# Patient Record
Sex: Male | Born: 1990 | Race: White | Hispanic: No | Marital: Single | Smoking: Current every day smoker
Health system: Southern US, Community
[De-identification: ages and names within clinical notes are randomized; demographics above are authoritative.]

## PROBLEM LIST (undated history)

## (undated) DIAGNOSIS — Z59819 Housing instability, housed unspecified: Secondary | ICD-10-CM

## (undated) DIAGNOSIS — R4689 Other symptoms and signs involving appearance and behavior: Secondary | ICD-10-CM

## (undated) DIAGNOSIS — Z560 Unemployment, unspecified: Secondary | ICD-10-CM

## (undated) DIAGNOSIS — F6381 Intermittent explosive disorder: Secondary | ICD-10-CM

## (undated) DIAGNOSIS — S069XAA Unspecified intracranial injury with loss of consciousness status unknown, initial encounter: Secondary | ICD-10-CM

## (undated) DIAGNOSIS — F99 Mental disorder, not otherwise specified: Secondary | ICD-10-CM

## (undated) DIAGNOSIS — F1010 Alcohol abuse, uncomplicated: Secondary | ICD-10-CM

## (undated) DIAGNOSIS — B159 Hepatitis A without hepatic coma: Secondary | ICD-10-CM

## (undated) DIAGNOSIS — F23 Brief psychotic disorder: Secondary | ICD-10-CM

## (undated) DIAGNOSIS — F329 Major depressive disorder, single episode, unspecified: Secondary | ICD-10-CM

## (undated) DIAGNOSIS — Z5900 Homelessness unspecified: Secondary | ICD-10-CM

## (undated) DIAGNOSIS — Z650 Conviction in civil and criminal proceedings without imprisonment: Secondary | ICD-10-CM

## (undated) DIAGNOSIS — F17200 Nicotine dependence, unspecified, uncomplicated: Secondary | ICD-10-CM

## (undated) DIAGNOSIS — R45851 Suicidal ideations: Secondary | ICD-10-CM

## (undated) DIAGNOSIS — F1910 Other psychoactive substance abuse, uncomplicated: Secondary | ICD-10-CM

## (undated) DIAGNOSIS — Z720 Tobacco use: Secondary | ICD-10-CM

## (undated) DIAGNOSIS — F1210 Cannabis abuse, uncomplicated: Secondary | ICD-10-CM

## (undated) DIAGNOSIS — F319 Bipolar disorder, unspecified: Secondary | ICD-10-CM

## (undated) DIAGNOSIS — F603 Borderline personality disorder: Secondary | ICD-10-CM

## (undated) DIAGNOSIS — R7689 Other specified abnormal immunological findings in serum: Secondary | ICD-10-CM

## (undated) DIAGNOSIS — F29 Unspecified psychosis not due to a substance or known physiological condition: Secondary | ICD-10-CM

## (undated) DIAGNOSIS — R768 Other specified abnormal immunological findings in serum: Secondary | ICD-10-CM

## (undated) HISTORY — PX: TONSILLECTOMY: SUR1361

---
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
## 2010-05-18 LAB — COMPREHENSIVE METABOLIC PANEL
ALT: 41 U/L (ref 0–53)
Albumin: 4 g/dL (ref 3.5–5.2)
Alkaline Phosphatase: 42 U/L (ref 39–117)
CO2: 27 mEq/L (ref 19–32)
Creatinine, Ser: 0.86 mg/dL (ref 0.4–1.5)
GFR calc Af Amer: >60 mL/min (ref >60)
GFR calc non Af Amer: >60 mL/min (ref >60)
Glucose, Bld: 101 mg/dL — ABNORMAL HIGH (ref 70–99)
Potassium: 3.6 mEq/L (ref 3.5–5.1)
Total Protein: 6.5 g/dL (ref 6.0–8.3)

## 2010-05-18 LAB — BASIC METABOLIC PANEL
Chloride: 101 mEq/L (ref 96–112)
Sodium: 138 mEq/L (ref 135–145)

## 2010-05-18 LAB — SURGICAL PCR SCREEN: Staphylococcus aureus: POSITIVE — AB

## 2010-05-18 LAB — LACTIC ACID, PLASMA: Lactic Acid, Venous: 2.6 mmol/L — ABNORMAL HIGH (ref 0.5–2.2)

## 2010-05-18 LAB — CBC
HCT: 38.9 % — ABNORMAL LOW (ref 39.0–52.0)
HCT: 45.7 % (ref 39.0–52.0)
MCH: 29.6 pg (ref 26.0–34.0)
MCH: 30.2 pg (ref 26.0–34.0)
MCHC: 35 g/dL (ref 30.0–36.0)
MCHC: 35 g/dL (ref 30.0–36.0)
MCV: 84.7 fL (ref 78.0–100.0)
Platelets: 247 10*3/uL (ref 150–400)
RDW: 12.3 % (ref 11.5–15.5)
WBC: 11.8 10*3/uL — ABNORMAL HIGH (ref 4.0–10.5)
WBC: 14.7 10*3/uL — ABNORMAL HIGH (ref 4.0–10.5)

## 2010-05-18 LAB — POCT CARDIAC MARKERS
CKMB, poc: <1 ng/mL — ABNORMAL LOW (ref 1.0–8.0)
Myoglobin, poc: 218 ng/mL (ref 12–200)

## 2010-05-18 LAB — APTT: aPTT: 29 seconds (ref 24–37)

## 2010-05-18 LAB — SAMPLE TO BLOOD BANK

## 2010-05-18 LAB — POCT I-STAT, CHEM 8
BUN: 8 mg/dL (ref 6–23)
Calcium, Ion: 1.13 mmol/L (ref 1.12–1.32)
Chloride: 105 mEq/L (ref 96–112)
Creatinine, Ser: 0.9 mg/dL (ref 0.4–1.5)
Glucose, Bld: 101 mg/dL — ABNORMAL HIGH (ref 70–99)
Sodium: 141 mEq/L (ref 135–145)

## 2010-05-18 LAB — RAPID URINE DRUG SCREEN, HOSP PERFORMED
Barbiturates: NOT DETECTED
Benzodiazepines: POSITIVE — AB
Cocaine: NOT DETECTED
Opiates: POSITIVE — AB

## 2010-05-18 LAB — PROTIME-INR
INR: 1.03 (ref 0.00–1.49)
Prothrombin Time: 13.7 seconds (ref 11.6–15.2)

## 2010-05-18 LAB — URINALYSIS, ROUTINE W REFLEX MICROSCOPIC
Glucose, UA: NEGATIVE mg/dL
Nitrite: NEGATIVE
Protein, ur: NEGATIVE mg/dL
Specific Gravity, Urine: 1.022 (ref 1.005–1.030)
Urobilinogen, UA: 1 mg/dL (ref 0.0–1.0)

## 2010-06-11 LAB — CBC
HCT: 44.2 % (ref 36.0–49.0)
Hemoglobin: 15.7 g/dL (ref 12.0–16.0)
MCV: 85.6 fL (ref 78.0–98.0)
Platelets: 261 10*3/uL (ref 150–400)
RBC: 5.16 MIL/uL (ref 3.80–5.70)
RDW: 13.1 % (ref 11.4–15.5)

## 2010-06-11 LAB — BASIC METABOLIC PANEL
BUN: 9 mg/dL (ref 6–23)
Calcium: 9.7 mg/dL (ref 8.4–10.5)
Creatinine, Ser: 0.73 mg/dL (ref 0.4–1.5)
Glucose, Bld: 105 mg/dL — ABNORMAL HIGH (ref 70–99)
Sodium: 141 mEq/L (ref 135–145)

## 2010-06-11 LAB — DIFFERENTIAL: Lymphocytes Relative: 32 % (ref 24–48)

## 2010-06-11 LAB — RAPID URINE DRUG SCREEN, HOSP PERFORMED
Barbiturates: NOT DETECTED
Tetrahydrocannabinol: POSITIVE — AB

## 2011-06-11 IMAGING — RF DG HUMERUS 2V *L*
1 series · 2 of 2 positions shown · non-contrast
Comparison: Plain films 10/09/2009.

CLINICAL DATA: Fracture fixation.

LEFT HUMERUS - 2+ VIEW

[Series 1: run · 2 of 2 slices shown]
[im 1/2]
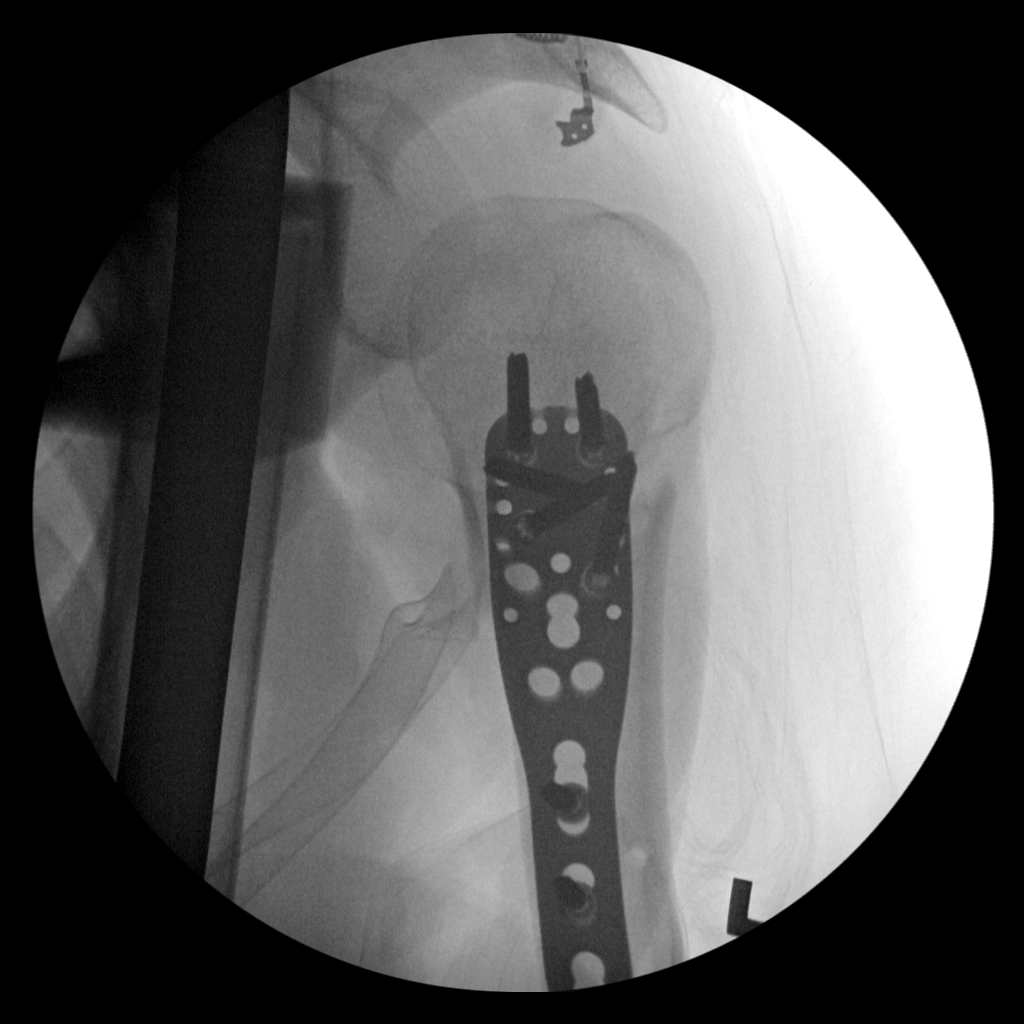
[im 2/2]
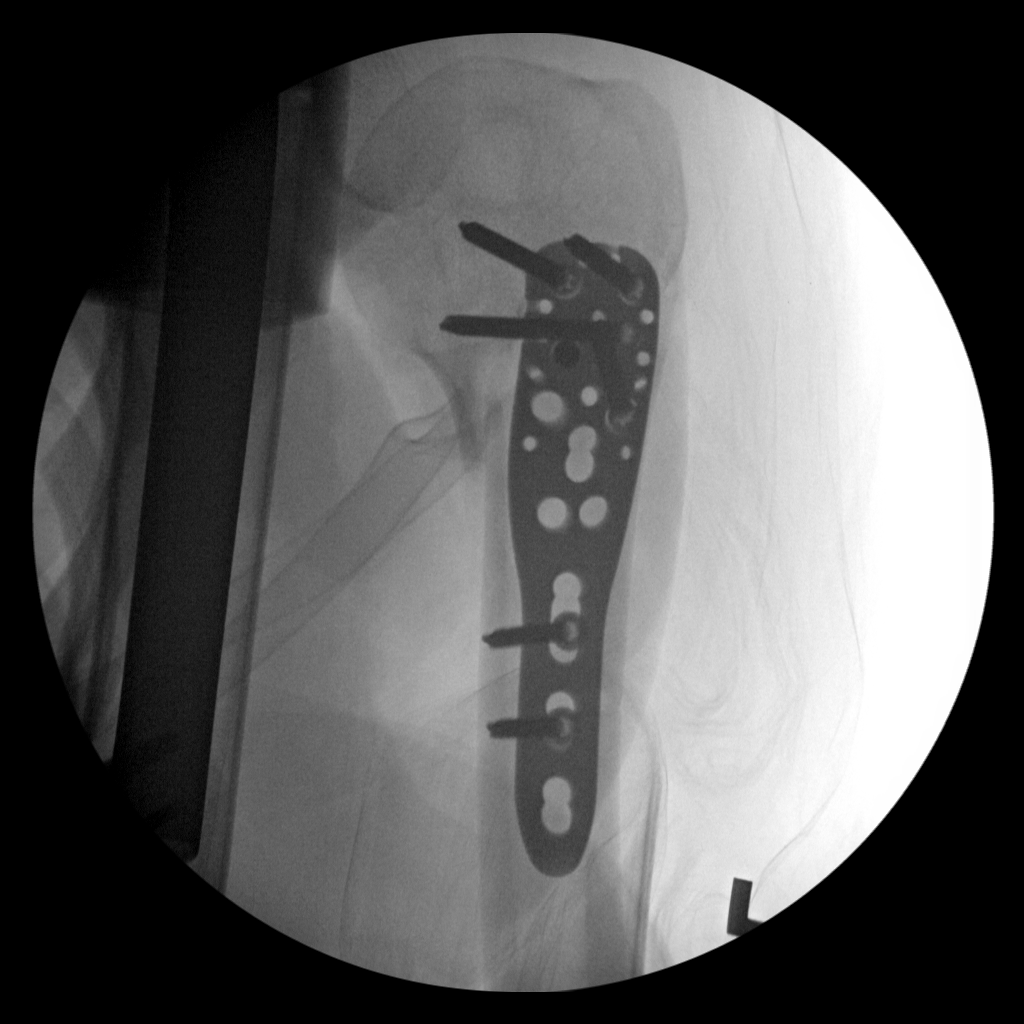

[2 of 2 positions shown; findings below may reference images not displayed]

FINDINGS: We are provided two fluoroscopic intraoperative spot
views of the left humerus.  Images demonstrate placement of a plate
and screws for fixation of a surgical neck fracture.  No evidence
of complication.
IMPRESSION: ORIF left hip fracture.

## 2012-05-13 ENCOUNTER — Emergency Department (HOSPITAL_COMMUNITY)
Admission: EM | Admit: 2012-05-13 | Discharge: 2012-05-13 | Disposition: A | Discharge status: Home / Self Care | Payer: Self-pay | Attending: Emergency Medicine | Admitting: Emergency Medicine

## 2012-05-13 ENCOUNTER — Encounter (HOSPITAL_COMMUNITY): Payer: Self-pay | Admitting: Emergency Medicine

## 2012-05-13 DIAGNOSIS — R109 Unspecified abdominal pain: Secondary | ICD-10-CM | POA: Insufficient documentation

## 2012-05-13 DIAGNOSIS — R197 Diarrhea, unspecified: Secondary | ICD-10-CM | POA: Insufficient documentation

## 2012-05-13 LAB — COMPREHENSIVE METABOLIC PANEL
Alkaline Phosphatase: 48 U/L (ref 39–117)
BUN: 12 mg/dL (ref 6–23)
Calcium: 9.5 mg/dL (ref 8.4–10.5)
GFR calc Af Amer: >90 mL/min (ref >90)
Glucose, Bld: 131 mg/dL — ABNORMAL HIGH (ref 70–99)
Potassium: 3.3 mEq/L — ABNORMAL LOW (ref 3.5–5.1)
Total Protein: 7.8 g/dL (ref 6.0–8.3)

## 2012-05-13 LAB — CBC
Hemoglobin: 16.3 g/dL (ref 13.0–17.0)
MCH: 30.2 pg (ref 26.0–34.0)
MCHC: 35.4 g/dL (ref 30.0–36.0)
Platelets: 226 10*3/uL (ref 150–400)
RBC: 5.39 MIL/uL (ref 4.22–5.81)
RDW: 12.3 % (ref 11.5–15.5)

## 2012-05-13 MED ORDER — POTASSIUM CHLORIDE CRYS ER 20 MEQ PO TBCR
40.0000 meq | EXTENDED_RELEASE_TABLET | Freq: Once | ORAL | Status: AC
  Administered 2012-05-13: 40 meq via ORAL
  Filled 2012-05-13: qty 2

## 2012-05-13 NOTE — ED Notes (Signed)
Pt c/o n/v/d starting this am around 10a.  Pt also c/o r lower back pain x1 day.

## 2012-05-13 NOTE — ED Provider Notes (Signed)
History     CSN: [id number]  Arrival date & time 05/13/12  1214   First MD Initiated Contact with Patient 05/13/12 1217      Chief Complaint  Patient presents with  . Nausea  . Emesis  . Diarrhea    (Consider location/radiation/quality/duration/timing/severity/associated sxs/prior treatment) HPI Comments: Patrick Washington is a 22 y.o. male with no significant past medical history presents to the emergency department with a chief complaint of abdominal pain.  Onset of symptoms began acutely this morning around 10 a.m. and were described as being in the right lower quadrant.  Pain was described as a "contraction" that was a 6/10 in severity.  At time of pain patient felt nauseous, but denies any emesis.  Symptoms were moderate but has since resolved completely. Pain did not radiate Abdominal pain lasted approximately 2 hours.  Patient's last normal bowel movement was yesterday.  Patient denies associated symptoms including flank pian, emesis, diarrhea, dysuria, hematuria, fever, night sweats, chills, anorexia, melena, hematochezia, hematemesis.  No other complaints at this time.  The history is provided by the patient.    History reviewed. No pertinent past medical history.  Past Surgical History  Procedure Laterality Date  . Tonsillectomy      No family history on file.  History  Substance Use Topics  . Smoking status: Not on file  . Smokeless tobacco: Not on file  . Alcohol Use: No      Review of Systems  Constitutional: Negative for fever, chills and appetite change.  HENT: Negative for congestion.   Eyes: Negative for visual disturbance.  Respiratory: Negative for shortness of breath.   Cardiovascular: Negative for chest pain and leg swelling.  Gastrointestinal: Positive for abdominal pain (since resolved ). Negative for vomiting, diarrhea, constipation, blood in stool, abdominal distention, anal bleeding and rectal pain.  Genitourinary: Negative for dysuria, urgency and  frequency.  Neurological: Negative for dizziness, syncope, weakness, light-headedness, numbness and headaches.  Psychiatric/Behavioral: Negative for confusion.  All other systems reviewed and are negative.    Allergies  Review of patient's allergies indicates no known allergies.  Home Medications  No current outpatient prescriptions on file.  BP 152/88  Pulse 75  Temp(Src) 97.4 F (36.3 C) (Oral)  Resp 16  SpO2 98%  Physical Exam  Constitutional: He is oriented to person, place, and time. He appears well-developed and well-nourished. No distress.  HENT:  Head: Normocephalic and atraumatic.  Mouth/Throat: Oropharynx is clear and moist. No oropharyngeal exudate.  Eyes: Conjunctivae and EOM are normal. Pupils are equal, round, and reactive to light. No scleral icterus.  Neck: Normal range of motion. Neck supple. No tracheal deviation present. No thyromegaly present.  Cardiovascular: Normal rate, regular rhythm, normal heart sounds and intact distal pulses.   Pulmonary/Chest: Effort normal and breath sounds normal. No stridor. No respiratory distress. He has no wheezes.  Abdominal: Soft. Bowel sounds are normal. He exhibits no distension and no mass. There is no tenderness. There is no rebound and no guarding.  Musculoskeletal: Normal range of motion. He exhibits no edema and no tenderness.  Neurological: He is alert and oriented to person, place, and time. Coordination normal.  Skin: Skin is warm and dry. No rash noted. He is not diaphoretic. No erythema. No pallor.  Psychiatric: He has a normal mood and affect. His behavior is normal.    ED Course  Procedures (including critical care time)  Labs Reviewed - No data to display No results found.   No diagnosis found.  BP 152/88  Pulse 75  Temp(Src) 97.4 F (36.3 C) (Oral)  Resp 16  SpO2 98%  MDM  Acute abdominal pain, resolved  Patient is a 22 year old male who presented to the emergency department complaining of  acute right lower quadrant abdominal pain lasting 2 hours.  There were no associated symptoms and pain has completely resolved.  Patient has been asymptomatic throughout his stay in the emergency department and has not required any medications.  Vital signs have been normal and labs reviewed without leukocytosis.  Mild hypokalemia seen which was replenished orally.  As patient is nontoxic and nonseptic appearing I do not feel further workup is indicated at this time.  Strict return precautions discussed if right lower quadrant abdominal pain presents again or for development of fever.  Patient is agreeable with plan and appears reliable for return.        Jaci Carrel, [place] 05/13/12 1449

## 2012-05-14 NOTE — ED Provider Notes (Signed)
Medical screening examination/treatment/procedure(s) were performed by non-physician practitioner and as supervising physician I was immediately available for consultation/collaboration.  Tobin Chad, MD 05/14/12 1006

## 2012-12-17 ENCOUNTER — Encounter (HOSPITAL_COMMUNITY): Payer: Self-pay | Admitting: Emergency Medicine

## 2012-12-17 ENCOUNTER — Emergency Department (HOSPITAL_COMMUNITY): Payer: No Typology Code available for payment source

## 2012-12-17 ENCOUNTER — Emergency Department (HOSPITAL_COMMUNITY)
Admission: EM | Admit: 2012-12-17 | Discharge: 2012-12-17 | Disposition: A | Discharge status: Home / Self Care | Payer: No Typology Code available for payment source | Attending: Emergency Medicine | Admitting: Emergency Medicine

## 2012-12-17 DIAGNOSIS — M545 Low back pain, unspecified: Secondary | ICD-10-CM | POA: Insufficient documentation

## 2012-12-17 DIAGNOSIS — Y9389 Activity, other specified: Secondary | ICD-10-CM | POA: Insufficient documentation

## 2012-12-17 DIAGNOSIS — Y92410 Unspecified street and highway as the place of occurrence of the external cause: Secondary | ICD-10-CM | POA: Insufficient documentation

## 2012-12-17 MED ORDER — METHOCARBAMOL 500 MG PO TABS: 500.0000 mg | ORAL_TABLET | Freq: Two times a day (BID) | ORAL | Status: DC

## 2012-12-17 MED ORDER — NAPROXEN 500 MG PO TABS: 500.0000 mg | ORAL_TABLET | Freq: Two times a day (BID) | ORAL | Status: DC

## 2012-12-17 MED ORDER — TRAMADOL HCL 50 MG PO TABS: 50.0000 mg | ORAL_TABLET | Freq: Four times a day (QID) | ORAL | Status: DC | PRN
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
## 2012-12-19 NOTE — ED Provider Notes (Signed)
Medical screening examination/treatment/procedure(s) were performed by non-physician practitioner and as supervising physician I was immediately available for consultation/collaboration.   Tajanay Hurley E Shenicka Sunderlin, MD 12/19/12 0812 
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
## 2019-03-05 DIAGNOSIS — K7682 Hepatic encephalopathy: Secondary | ICD-10-CM

## 2019-03-05 HISTORY — DX: Hepatic encephalopathy: K76.82

## 2020-07-13 ENCOUNTER — Emergency Department (HOSPITAL_COMMUNITY): Payer: Self-pay

## 2020-07-13 ENCOUNTER — Emergency Department (HOSPITAL_COMMUNITY)
Admission: EM | Admit: 2020-07-13 | Discharge: 2020-07-14 | Disposition: A | Discharge status: Home / Self Care | Payer: Self-pay | Attending: Emergency Medicine | Admitting: Emergency Medicine

## 2020-07-13 DIAGNOSIS — F1910 Other psychoactive substance abuse, uncomplicated: Secondary | ICD-10-CM

## 2020-07-13 DIAGNOSIS — S40212A Abrasion of left shoulder, initial encounter: Secondary | ICD-10-CM | POA: Insufficient documentation

## 2020-07-13 DIAGNOSIS — Y9302 Activity, running: Secondary | ICD-10-CM | POA: Insufficient documentation

## 2020-07-13 DIAGNOSIS — R4182 Altered mental status, unspecified: Secondary | ICD-10-CM | POA: Insufficient documentation

## 2020-07-13 DIAGNOSIS — W228XXA Striking against or struck by other objects, initial encounter: Secondary | ICD-10-CM | POA: Insufficient documentation

## 2020-07-13 DIAGNOSIS — Y92481 Parking lot as the place of occurrence of the external cause: Secondary | ICD-10-CM | POA: Insufficient documentation

## 2020-07-13 DIAGNOSIS — F29 Unspecified psychosis not due to a substance or known physiological condition: Secondary | ICD-10-CM | POA: Insufficient documentation

## 2020-07-13 DIAGNOSIS — R451 Restlessness and agitation: Secondary | ICD-10-CM | POA: Insufficient documentation

## 2020-07-13 MED ORDER — LORAZEPAM 2 MG/ML IJ SOLN: 2.0000 mg | Freq: Once | INTRAMUSCULAR | Status: DC

## 2020-07-13 MED ORDER — ZIPRASIDONE MESYLATE 20 MG IM SOLR: 20.0000 mg | Freq: Once | INTRAMUSCULAR | Status: DC
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
## 2020-07-13 NOTE — ED Notes (Signed)
X-ray at bedside
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
## 2020-07-14 ENCOUNTER — Emergency Department (HOSPITAL_COMMUNITY): Payer: Self-pay

## 2020-07-14 LAB — CBC WITH DIFFERENTIAL/PLATELET
Abs Immature Granulocytes: 0.03 10*3/uL (ref 0.00–0.07)
Basophils Absolute: 0 10*3/uL (ref 0.0–0.1)
Basophils Relative: 0 %
Eosinophils Absolute: 0 10*3/uL (ref 0.0–0.5)
Eosinophils Relative: 0 %
HCT: 48.8 % (ref 39.0–52.0)
Hemoglobin: 16.5 g/dL (ref 13.0–17.0)
Immature Granulocytes: 0 %
Lymphocytes Relative: 6 %
Lymphs Abs: 0.5 10*3/uL — ABNORMAL LOW (ref 0.7–4.0)
MCH: 30.3 pg (ref 26.0–34.0)
MCHC: 33.8 g/dL (ref 30.0–36.0)
MCV: 89.5 fL (ref 80.0–100.0)
Monocytes Absolute: 0.4 10*3/uL (ref 0.1–1.0)
Monocytes Relative: 5 %
Neutro Abs: 7.8 10*3/uL — ABNORMAL HIGH (ref 1.7–7.7)
Neutrophils Relative %: 89 %
Platelets: 164 10*3/uL (ref 150–400)
RBC: 5.45 MIL/uL (ref 4.22–5.81)
RDW: 12.6 % (ref 11.5–15.5)
WBC: 8.8 10*3/uL (ref 4.0–10.5)
nRBC: 0 % (ref 0.0–0.2)

## 2020-07-14 LAB — COMPREHENSIVE METABOLIC PANEL
ALT: 20 U/L (ref 0–44)
AST: 20 U/L (ref 15–41)
Albumin: 4.7 g/dL (ref 3.5–5.0)
Alkaline Phosphatase: 41 U/L (ref 38–126)
Anion gap: 7 (ref 5–15)
BUN: 12 mg/dL (ref 6–20)
CO2: 27 mmol/L (ref 22–32)
Calcium: 9.3 mg/dL (ref 8.9–10.3)
Chloride: 104 mmol/L (ref 98–111)
Creatinine, Ser: 0.86 mg/dL (ref 0.61–1.24)
GFR, Estimated: >60 mL/min (ref >60)
Glucose, Bld: 145 mg/dL — ABNORMAL HIGH (ref 70–99)
Potassium: 3.5 mmol/L (ref 3.5–5.1)
Sodium: 138 mmol/L (ref 135–145)
Total Bilirubin: 1.2 mg/dL (ref 0.3–1.2)
Total Protein: 7.3 g/dL (ref 6.5–8.1)

## 2020-07-14 LAB — SALICYLATE LEVEL: Salicylate Lvl: <7 mg/dL — ABNORMAL LOW (ref 7.0–30.0)

## 2020-07-14 LAB — RAPID URINE DRUG SCREEN, HOSP PERFORMED
Amphetamines: NOT DETECTED
Barbiturates: NOT DETECTED
Benzodiazepines: NOT DETECTED
Cocaine: NOT DETECTED
Opiates: NOT DETECTED
Tetrahydrocannabinol: POSITIVE — AB

## 2020-07-14 LAB — ACETAMINOPHEN LEVEL: Acetaminophen (Tylenol), Serum: <10 ug/mL — ABNORMAL LOW (ref 10–30)

## 2020-07-14 LAB — CK: Total CK: 69 U/L (ref 49–397)

## 2020-07-14 LAB — ETHANOL: Alcohol, Ethyl (B): <10 mg/dL (ref <10)

## 2020-07-14 NOTE — ED Notes (Signed)
Patient was incontinent of urine. Linens were changed. Patient was placed in a gown and socks. Patient is now sitting up eating, drinking and talking.
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
## 2020-07-14 NOTE — ED Notes (Signed)
Patient is refusing to go to CT. States he does not need it. I explained the reasoning for the CT.

## 2020-07-14 NOTE — ED Provider Notes (Signed)
Patient now awake and alert.  He reports he consumed large amount of mushrooms and that is the last thing he remembers.  He does not recall being agitated.  He is awake and alert and eating a sandwich.  We will cancel CT imaging.  He has no pain complaints.  He does have an abrasion to his left hand that is nontender. Will continue to monitor.     Zadie Rhine, MD 07/14/20 [phone]
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
## 2020-07-14 NOTE — Discharge Instructions (Addendum)
Substance Abuse Treatment Programs ° °Intensive Outpatient Programs °[facility]     °[address]      °[place], [place]                   °[phone]      ° °[facility] °[address] °[place], [place] °336-379-7146 ° °[facility]     °(Inpatient and outpatient)     °[address]           °[phone]   ° °[facility] °336-288-1484 (Suboxone and Methadone) ° °[address]      °[place], [place] [address]      °336-882-2125      ° °[address] °[place], [place] °852-3033 ° °[facility] (Outpatient/Inpatient, Chemical)    °(insurance only) [phone]      °       °[facility] (Groups & Residential) °[place], [place] °336-389-1413 ° °   °[facility]     °[address]     °[place], [place]      °[phone]      ° °[facility] (for caregivers and family) °[address] °[place], [place] °336-299-4655 ° ° ° ° ° °Residential Treatment Programs °[facility]      °[address], [place], [place] [address]  °([phone]      ° °[facility] °[address] [place], [place] [address] °[phone] ° °[facility]        °336-248-8914      ° °[facility] °1-800-659-3381 ° °[facility]             °[address]                                         °[place], [place]                                                °877-615-2722 or 336-784-9470                              ° °[facility] [place] °[address] °[place], [address] °1.877.941.8954 ° °[facility]    °[address]      °[place], [place]     °[phone]      ° °[facility] [place] [facility] °[address] °, [place] °336-285-9073 ° °[facility]   °[address]     °[place], [place] [address]     °336-899-1550      °Admissions: 8am-3pm M-F ° °[facility] °[address] °[place],  [place] °336-227-7417 ° °[facility]: Residential Program (90 Days)   °[place], [place]      °336-725-8389 or 800-758-6077    ° °[facility] °[place], [place] °(Walk in Hours over the weekend or by referral) ° °[facility] °[address], [place], [place] [address] °(336) [phone] ° °Crisis Mobile: [facility]:  1-877-626-1772 (for crisis response 24 hours a day) °[facility] Hotline:      1-800-256-2452 °Outpatient Psychiatry and Counseling ° °[facility]: Mobile Crisis   Management 24 hours:  1-877-626-1772 ° °[facility] sliding scale fee and walk in schedule: M-F 8am-12pm/1pm-3pm °[address]  °[place], [place] [address] °[phone] ° °[facility] °[address] °[place], [place] [address] °336-703-9650 ° °[facility] ([facility])- new patient walk-in appointments available Monday - Friday 8am -3pm.          °[address] °[place], [place] [address] °336-676-6840 or crisis line- 336-676-6905 ° °[facility]/ Intensive Outpatient Therapy Program °[address] °[place], [place] [address] °336-832-9804 ° °[facility]                  °Crisis Services      °336.641.4993      °[address]     °[place], [place] [address]                ° °[facility]   °[facility] °[phone] °[address] °[place], [place] [address] ° ° °[facility]          °[address],  °[place], [place] [address]       °(336) [phone] ° °[facility] °[address] °[place], [place] [address] °336-292-1510 ° °[facility]    °[address]    °[place], [place] [address]     °336-632-3505      ° °Parish McKinney, MD     °[address]     °[place] [address]     °336-282-1251     °  °[facility] °[address] °[place] [address] ° °[facility]     °[address]     °[place], [place]      °336-542-2076      ° °[facility] °Shamsher Ahluwalia, MD °[address] °[place], [place] [address] °336-420-9558 ° °[facility]     °[address]     °[place], [place] [address]     °336-274-1237      ° °[facility] °[address] °[place], [place] [address] °336-854-4450 °Resources for Temporary Residential Assistance/Crisis Centers ° °DAY CENTERS °[facility] °M-F 8am-3pm   °[address] [place], [place] [address]   336-332-0824 °Services include: laundry, barbering, support groups, case management, phone  & computer access, showers, AA/NA mtgs, mental health/substance abuse nurse, job skills class, disability information, VA assistance, spiritual classes, etc.  ° °HOMELESS SHELTERS ° °[facility]     °[facility]   °[address], [place]     °336.271.5959       °       °[facility] (women and children)       °[address] °[place], [place] [address] °336-275-0820 °[email or web address] for application and process °Application Required ° °[facility]   °[address]    °[place] [address]     °336.886.4922       °             °[facility] °[address] °[place], [place] [address] °336.273.5572 °336-235-0363(schedule application appt.) °Application Required ° °[facility] (women only)    °[address]     °[place], [place] [address]     °336-884-1039      °  Intake starts 6pm daily °Need valid ID, SSC, & Police report °[facility] [place] °[address] °[place], [place] °336-881-5420 °Application Required ° °[facility] (men only)     °[address]      °[place], [place]     °[phone]      ° °[facility] °(Pregnant women only) °[address] °, [place] °336-275-0206 ° °[facility]      °[address]      °[place], [place] [address]     °336-722-9951      °       °[facility] [place] [facility] °[address] °[place], [place] °336-723-1848 °90 day commitment/SA/Application process ° °[facility](men only)     °[address]     °[place], [place]     °336-748-1962       °Check-in at 7pm     °       °[facility] °[address] °[place], [place] [address] °336-248-6684 °Men/Women/Women and Children must be there by 7 pm ° °[facility] °[place], [place] °336-722-8721                ° °

## 2020-07-14 NOTE — ED Notes (Signed)
Patient was placed in scrubs. He ambulated around the room and to the chair. Patient was given a Conservation officer, nature.

## 2020-07-14 NOTE — ED Provider Notes (Signed)
Pt requesting d/c home IVC has been rescinded He denies SI  He does not appear psychotic This was all likely substance induced Pt reports if he can get a ride he can get into his apartment    Zadie Rhine, MD 07/14/20 0231

## 2021-04-17 ENCOUNTER — Ambulatory Visit (HOSPITAL_COMMUNITY)
Admission: EM | Admit: 2021-04-17 | Discharge: 2021-04-17 | Disposition: A | Discharge status: Home / Self Care | Payer: No Payment, Other | Attending: Psychiatry | Admitting: Psychiatry

## 2021-04-17 DIAGNOSIS — F69 Unspecified disorder of adult personality and behavior: Secondary | ICD-10-CM

## 2021-04-17 DIAGNOSIS — R4689 Other symptoms and signs involving appearance and behavior: Secondary | ICD-10-CM | POA: Insufficient documentation

## 2021-04-17 NOTE — Discharge Instructions (Addendum)
Patient is instructed prior to discharge to: Take all medications as prescribed by his/her mental healthcare provider. °Report any adverse effects and or reactions from the medicines to his/her outpatient provider promptly. °Patient has been instructed & cautioned: To not engage in alcohol and or illegal drug use while on prescription medicines. °In the event of worsening symptoms, patient is instructed to call the crisis hotline, 911 and or go to the nearest ED for appropriate evaluation and treatment of symptoms. °To follow-up with his/her primary care provider for your other medical issues, concerns and or health care needs. ° °Please contact one of the following facilities to start medication management and therapy services:  ° °[facility] °[address] (SECOND FLOOR)  °[place], [place]  [address] °Phone: [phone] ° °[facility]  °[address] °[place], [place] [address] °Phone: [phone] ° °[facility] [place]  °[address]  °[place], [place] [address] ° °[facility] [place]  °[address]  °[place], [place] [address] °Phone: [phone]  °   °
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
## 2021-04-17 NOTE — Progress Notes (Signed)
Patrick Washington received his AVS, questions answered and he was discharged to the lobby without incident.

## 2021-04-19 ENCOUNTER — Ambulatory Visit (HOSPITAL_COMMUNITY)
Admission: EM | Admit: 2021-04-19 | Discharge: 2021-04-19 | Disposition: A | Discharge status: Home / Self Care | Payer: No Payment, Other

## 2021-04-19 NOTE — ED Triage Notes (Signed)
Pt Patrick Washington presents to [facility] with a complaint of worsening symptoms of depression. Pt is a poor historian. Pt states that he is here because he has been having trouble speaking with his high school sweetheart. Pt appears to be confused during triage process. Pt states " I think she is here, she pops up everywhere and I have a hard time trying to talk to her because I get nervous talking to people". Pt states that because of his encounters with this woman and his confusion he has been having passive SI. Pt states that he does NOT have a plan and does not have any intentions on killing himself. Pt denies HI and AVH at this time. Pt denies alcohol or drug use. Pt is routine.

## 2021-04-20 ENCOUNTER — Other Ambulatory Visit: Payer: Self-pay

## 2021-04-20 ENCOUNTER — Ambulatory Visit (HOSPITAL_COMMUNITY)
Admission: EM | Admit: 2021-04-20 | Discharge: 2021-04-24 | Disposition: A | Discharge status: Other Acute Inpatient Hospital | Payer: No Payment, Other | Attending: Student | Admitting: Student

## 2021-04-20 DIAGNOSIS — Z20822 Contact with and (suspected) exposure to covid-19: Secondary | ICD-10-CM | POA: Insufficient documentation

## 2021-04-20 DIAGNOSIS — F322 Major depressive disorder, single episode, severe without psychotic features: Secondary | ICD-10-CM | POA: Insufficient documentation

## 2021-04-20 DIAGNOSIS — R45851 Suicidal ideations: Secondary | ICD-10-CM | POA: Diagnosis not present

## 2021-04-20 LAB — RESP PANEL BY RT-PCR (FLU A&B, COVID) ARPGX2
Influenza A by PCR: NEGATIVE
Influenza B by PCR: NEGATIVE
SARS Coronavirus 2 by RT PCR: NEGATIVE

## 2021-04-20 LAB — POC SARS CORONAVIRUS 2 AG -  ED: SARS Coronavirus 2 Ag: NEGATIVE

## 2021-04-20 MED ORDER — DIPHENHYDRAMINE HCL 50 MG PO CAPS
50.0000 mg | ORAL_CAPSULE | Freq: Three times a day (TID) | ORAL | Status: DC | PRN
  Administered 2021-04-21: 50 mg via ORAL
  Filled 2021-04-20: qty 1

## 2021-04-20 MED ORDER — LORAZEPAM 1 MG PO TABS
2.0000 mg | ORAL_TABLET | Freq: Three times a day (TID) | ORAL | Status: DC | PRN
  Administered 2021-04-21 (×2): 2 mg via ORAL
  Filled 2021-04-20 (×3): qty 2

## 2021-04-20 MED ORDER — DIPHENHYDRAMINE HCL 50 MG/ML IJ SOLN
50.0000 mg | Freq: Three times a day (TID) | INTRAMUSCULAR | Status: DC | PRN
  Administered 2021-04-20 – 2021-04-23 (×3): 50 mg via INTRAMUSCULAR
  Filled 2021-04-20 (×4): qty 1

## 2021-04-20 MED ORDER — HALOPERIDOL LACTATE 5 MG/ML IJ SOLN
5.0000 mg | Freq: Three times a day (TID) | INTRAMUSCULAR | Status: DC | PRN
  Administered 2021-04-20 – 2021-04-23 (×3): 5 mg via INTRAMUSCULAR
  Filled 2021-04-20 (×4): qty 1

## 2021-04-20 MED ORDER — TRAZODONE HCL 50 MG PO TABS
50.0000 mg | ORAL_TABLET | Freq: Every evening | ORAL | Status: DC | PRN
  Administered 2021-04-21 – 2021-04-22 (×2): 50 mg via ORAL
  Filled 2021-04-20 (×3): qty 1

## 2021-04-20 MED ORDER — MAGNESIUM HYDROXIDE 400 MG/5ML PO SUSP: 30.0000 mL | Freq: Every day | ORAL | Status: DC | PRN

## 2021-04-20 MED ORDER — NICOTINE 14 MG/24HR TD PT24
14.0000 mg | MEDICATED_PATCH | Freq: Every day | TRANSDERMAL | Status: DC
  Administered 2021-04-20 – 2021-04-22 (×3): 14 mg via TRANSDERMAL
  Filled 2021-04-20 (×4): qty 1

## 2021-04-20 MED ORDER — LORAZEPAM 2 MG/ML IJ SOLN
2.0000 mg | Freq: Three times a day (TID) | INTRAMUSCULAR | Status: DC | PRN
  Administered 2021-04-20 – 2021-04-23 (×3): 2 mg via INTRAMUSCULAR
  Filled 2021-04-20 (×3): qty 1

## 2021-04-20 MED ORDER — LORAZEPAM 1 MG PO TABS
1.0000 mg | ORAL_TABLET | Freq: Once | ORAL | Status: AC
  Administered 2021-04-20: 1 mg via ORAL
  Filled 2021-04-20: qty 1

## 2021-04-20 MED ORDER — HYDROXYZINE HCL 25 MG PO TABS: 25.0000 mg | ORAL_TABLET | Freq: Three times a day (TID) | ORAL | Status: DC | PRN

## 2021-04-20 MED ORDER — HALOPERIDOL 5 MG PO TABS
5.0000 mg | ORAL_TABLET | Freq: Three times a day (TID) | ORAL | Status: DC | PRN
  Filled 2021-04-20: qty 1

## 2021-04-20 MED ORDER — ACETAMINOPHEN 325 MG PO TABS: 650.0000 mg | ORAL_TABLET | Freq: Four times a day (QID) | ORAL | Status: DC | PRN

## 2021-04-20 MED ORDER — ALUM & MAG HYDROXIDE-SIMETH 200-200-20 MG/5ML PO SUSP: 30.0000 mL | ORAL | Status: DC | PRN

## 2021-04-20 NOTE — ED Notes (Signed)
Pt was given muffin.

## 2021-04-20 NOTE — ED Notes (Signed)
Pt sleeps quietly with movement at times.

## 2021-04-20 NOTE — Progress Notes (Signed)
Patient resting with even an unlabored respirations at this time. Nursing staff will continue to monitor.

## 2021-04-20 NOTE — ED Notes (Addendum)
Pt assessed in continuous observation lying in bed. Pt is irritable and answering questions with short yes/no answers. Unwilling to elaborate at present. Pt denies SI, HI and AVH at present. Pt A&O with no signs of acute distress, although pt was medicated just before shift change for agitation. Will continue to monitor for safety.

## 2021-04-20 NOTE — ED Notes (Signed)
Pt refuses labs at this time.
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
## 2021-04-20 NOTE — Progress Notes (Signed)
Patient attempted to elope off unit. Provider made aware. Patient offered PO medications, for agitation and anxiety but declined. Staff will continue to monitor.

## 2021-04-20 NOTE — Progress Notes (Signed)
Patient making inappropriate comments to male peer on the unit. Patient very watchful of male staff on unit as well.

## 2021-04-20 NOTE — ED Notes (Signed)
Pt refused blood work and Jacquiline Doe NP made aware

## 2021-04-20 NOTE — Progress Notes (Signed)
IVC paperwork refaxed to magistrate office

## 2021-04-20 NOTE — ED Notes (Signed)
Pt was given sub and juice for lunch.  

## 2021-04-20 NOTE — ED Notes (Signed)
Pt is irritated and combative.

## 2021-04-20 NOTE — Progress Notes (Signed)
Patient very irritable, cussing at staff  and demanding to see provider.Provider notified via secure chat.

## 2021-04-20 NOTE — Progress Notes (Signed)
Patient has been faxed out per the request of Darrol Angel, NP. Patient meets [facility] inpatient per Darrol Angel, NP. Patient has been faxed out to the following facilities:    [facility]  [address], [place] [address] [phone] [place]  [address], [place] [address] [phone]  [place]  [address] [place] [address] [phone]  [place]  [address] [place] [address] [phone]  [facility]  [place], [place] [address] [phone]  [facility]  [address] [place], [place] [address] [place] [address] [place]  [facility]  [address], [place] [address] [phone]  [facility]  [address], [place]  [address] [phone] [place], MSW, LCSW-A  2:29 PM 04/20/2021

## 2021-04-20 NOTE — ED Notes (Signed)
Pt is starting to make other pt feel uncomfortable by making inappropriate statements.
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
## 2021-04-20 NOTE — ED Notes (Signed)
Pt sleeping in continuous observation. Respirations even and unlabored with no signs of acute distress. Will continue to monitor for safety.  °

## 2021-04-20 NOTE — ED Notes (Signed)
Pt is yelling out to leave. Pt states that we can't keep him here and that he will be homeless.

## 2021-04-20 NOTE — ED Provider Notes (Signed)
Behavioral Health Progress Note  Date and Time: 04/20/2021 5:11 PM Name: Patrick Washington MRN:  [id number]  Subjective: "I had a small mental breakdown yesterday. I was looking for a girl that I thought was here that I have known for 15 years."  He endorses suicidal ideations with a plan to "drive my car into a wall or buy a gun and pull the trigger."  He states, "I have wanted to die my entire life." He denies HI. He denies AVH. There is no evidence that the patient is responding to internal or external stimuli.    Patient states that he has a problem with black people and that he was apart of the [employer] and the Nazis. Patient states that the girl looks like like a transgender and the mermaid on his shirts. He states that he saw someone who looks like her come here and he is looking for her.   Patient denies recent drug use. He reports using pain pills two weeks ago. He reports drinking alcohol "rarely."  Diagnosis:  Final diagnoses:  MDD (major depressive disorder), single episode, severe (HCC)  Suicidal ideation    Total Time spent with patient: 20 minutes  Past Psychiatric History: Patient denies history of any previous inpatient psychiatric hospitalizations.  Per chart review, patient recently presented to the [facility] on 04/17/2021, did not meet criteria for inpatient psychiatric treatment at that time, and was discharged with outpatient resources for psychotropic medication management, therapy, and substance abuse treatment.  Chart review also shows that patient presented to [facility] emergency department on 07/13/2020 via EMS due to being found by law enforcement naked in a parking deck, exhibiting odd behaviors.  Per chart review, patient was placed in four-point restraint during this 07/13/2020 to the ED evaluation and patient reported that he ingested mushrooms at the time of this ED evaluation as well.  Per chart review, patient was under IVC during this 07/13/2020 ED encounter, but patient's IVC  was ultimately rescinded and patient was discharged from the ED as it was documented that patient did not appear to be psychotic, patient's behaviors were determined to be likely related to use of substances, and patient was not suicidal at that time.  Of note, patient's 07/13/20 UDS was positive for THC. Past Medical History: No past medical history on file.  Past Surgical History:  Procedure Laterality Date   TONSILLECTOMY     Family History: No family history on file. Family Psychiatric  History: No family hx reported.  Social History:  Social History   Substance and Sexual Activity  Alcohol Use No     Social History   Substance and Sexual Activity  Drug Use No    Social History   Socioeconomic History   Marital status: Single    Spouse name: Not on file   Number of children: Not on file   Years of education: Not on file   Highest education level: Not on file  Occupational History   Not on file  Tobacco Use   Smoking status: Not on file   Smokeless tobacco: Not on file  Substance and Sexual Activity   Alcohol use: No   Drug use: No   Sexual activity: Not on file  Other Topics Concern   Not on file  Social History Narrative   Not on file   Social Determinants of Health   Financial Resource Strain: Not on file  Food Insecurity: Not on file  Transportation Needs: Not on file  Physical Activity:  Not on file  Stress: Not on file  Social Connections: Not on file   SDOH:  SDOH Screenings   Alcohol Screen: Not on file  Depression (PHQ2-9): Not on file  Financial Resource Strain: Not on file  Food Insecurity: Not on file  Housing: Not on file  Physical Activity: Not on file  Social Connections: Not on file  Stress: Not on file  Tobacco Use: Not on file  Transportation Needs: Not on file   Additional Social History:    Pain Medications: See MAR Prescriptions: See MAR Over the Counter: See MAR   Current Medications:  Current Facility-Administered  Medications  Medication Dose Route Frequency Provider Last Rate Last Admin   acetaminophen (TYLENOL) tablet 650 mg  650 mg Oral Q6H PRN Jaclyn Shaggy, PA-C       alum & mag hydroxide-simeth (MAALOX/MYLANTA) 200-200-20 MG/5ML suspension 30 mL  30 mL Oral Q4H PRN Melbourne Abts W, PA-C       diphenhydrAMINE (BENADRYL) capsule 50 mg  50 mg Oral Q8H PRN Melbourne Abts W, PA-C       Or   diphenhydrAMINE (BENADRYL) injection 50 mg  50 mg Intramuscular Q8H PRN Ladona Ridgel, Cody W, PA-C       haloperidol (HALDOL) tablet 5 mg  5 mg Oral Q8H PRN Melbourne Abts W, PA-C       Or   haloperidol lactate (HALDOL) injection 5 mg  5 mg Intramuscular Q8H PRN Ladona Ridgel, Cody W, PA-C       hydrOXYzine (ATARAX) tablet 25 mg  25 mg Oral TID PRN Melbourne Abts W, PA-C       LORazepam (ATIVAN) tablet 2 mg  2 mg Oral Q8H PRN Melbourne Abts W, PA-C       Or   LORazepam (ATIVAN) injection 2 mg  2 mg Intramuscular Q8H PRN Melbourne Abts W, PA-C       magnesium hydroxide (MILK OF MAGNESIA) suspension 30 mL  30 mL Oral Daily PRN Ladona Ridgel, Cody W, PA-C       nicotine (NICODERM CQ - dosed in mg/24 hours) patch 14 mg  14 mg Transdermal Daily Melbourne Abts W, PA-C   14 mg at 04/20/21 1049   traZODone (DESYREL) tablet 50 mg  50 mg Oral QHS PRN Jaclyn Shaggy, PA-C       No current outpatient medications on file.    Labs  Lab Results:  Admission on 04/20/2021  Component Date Value Ref Range Status   SARS Coronavirus 2 by RT PCR 04/20/2021 NEGATIVE  NEGATIVE Final   Comment: (NOTE) SARS-CoV-2 target nucleic acids are NOT DETECTED.  The SARS-CoV-2 RNA is generally detectable in upper respiratory specimens during the acute phase of infection. The lowest concentration of SARS-CoV-2 viral copies this assay can detect is 138 copies/mL. A negative result does not preclude SARS-Cov-2 infection and should not be used as the sole basis for treatment or other patient management decisions. A negative result may occur with  improper specimen  collection/handling, submission of specimen other than nasopharyngeal swab, presence of viral mutation(s) within the areas targeted by this assay, and inadequate number of viral copies(<138 copies/mL). A negative result must be combined with clinical observations, patient history, and epidemiological information. The expected result is Negative.  Fact Sheet for Patients:  BloggerCourse.com  Fact Sheet for Healthcare Providers:  SeriousBroker.it  This test is no  t yet approved or cleared by the [place] FDA and  has been authorized for detection and/or diagnosis of SARS-CoV-2 by FDA under an Emergency Use Authorization (EUA). This EUA will remain  in effect (meaning this test can be used) for the duration of the COVID-19 declaration under Section 564(b)(1) of the Act, 21 U.S.C.section 360bbb-3(b)(1), unless the authorization is terminated  or revoked sooner.       Influenza A by PCR 04/20/2021 NEGATIVE  NEGATIVE Final   Influenza B by PCR 04/20/2021 NEGATIVE  NEGATIVE Final   Comment: (NOTE) The Xpert Xpress SARS-CoV-2/FLU/RSV plus assay is intended as an aid in the diagnosis of influenza from Nasopharyngeal swab specimens and should not be used as a sole basis for treatment. Nasal washings and aspirates are unacceptable for Xpert Xpress SARS-CoV-2/FLU/RSV testing.  Fact Sheet for Patients: BloggerCourse.comhttps://www.fda.gov/media/152166/download  Fact Sheet for Healthcare Providers: SeriousBroker.ithttps://www.fda.gov/media/152162/download  This test is not yet approved or cleared by the [place] FDA and has been authorized for detection and/or diagnosis of SARS-CoV-2 by FDA under an Emergency Use Authorization (EUA). This EUA will remain in effect (meaning this test can be used) for the duration of the COVID-19 declaration under Section 564(b)(1) of the Act, 21 U.S.C. section 360bbb-3(b)(1), unless the authorization is  terminated or revoked.  Performed at [facility], [address] [place], [place] [address]    SARS Coronavirus 2 Ag 04/20/2021 Negative  Negative Preliminary    Blood Alcohol level:  Lab Results  Component Value Date   [facility] <10 [facility]/13/2020   [facility]    [facility]5        LOWEST DETECTABLE LIMIT FOR SERUM ALCOHOL IS 5 mg/dL FOR MEDICAL PURPOSES ONLY    Metabolic Disorder Labs: No results found for: HGBA1C, MPG No results found for: PROLACTIN No results found for: CHOL, TRIG, HDL, CHOLHDL, VLDL, LDLCALC  Therapeutic Lab Levels: No results found for: LITHIUM No results found for: VALPROATE No components found for:  CBMZ  Physical Findings     Musculoskeletal  Strength & Muscle Tone: within normal limits Gait & Station: normal Patient leans: N/A  Psychiatric Specialty Exam  Presentation  General Appearance: Appropriate for Environment  Eye Contact:Fair  Speech:Clear and Coherent  Speech Volume:Normal  Handedness:Right   Mood and Affect  Mood:Anxious  Affect:Congruent   Thought Process  Thought Processes:Coherent  Descriptions of Associations:Circumstantial  Orientation:Full (Time, Place and Person)  Thought Content:Delusions  Diagnosis of Schizophrenia or Schizoaffective disorder in past: No    Hallucinations:Hallucinations: None  Ideas of Reference:None  Suicidal Thoughts:Suicidal Thoughts: Yes, Active SI Active Intent and/or Plan: With Plan  Homicidal Thoughts:Homicidal Thoughts: No   Sensorium  Memory:Immediate Fair; Recent Fair; Remote Fair  Judgment:Poor  Insight:Poor   Executive Functions  Concentration:Fair  Attention Span:Fair  Recall:Fair  Fund of Knowledge:Fair  Language:Fair   Psychomotor Activity  Psychomotor Activity:Psychomotor Activity: Normal   Assets  Assets:Communication Skills; Housing; Leisure Time; Physical Health   Sleep  Sleep:Sleep: Fair Number of Hours of Sleep: 6   Nutritional  Assessment (For OBS and [facility]BC admissions only) Has the patient had a weight loss or gain of 10 pounds or more in the last 3 months?: No Has the patient had a decrease in food intake/or appetite?: -- (Patient reports that his appetite fluctuates.) Does the patient have dental problems?: No Does the patient have eating habits or behaviors that may be indicators of an eating disorder including binging or inducing vomiting?: No Has the patient recently lost weight  without trying?: 1 Has the patient been eating poorly because of a decreased appetite?: -- (Patient reports that his appetite fluctuates.)    Physical Exam  Physical Exam Constitutional:      Appearance: Normal appearance.  HENT:     Head: Normocephalic and atraumatic.     Nose: Nose normal.  Eyes:     Comments: Lateral portion of Patient's left bulbar conjunctiva appears to have bloodshot-like appearance/small subconjunctival hemorrhage.  Patient's left eyelid appears to be slightly swollen.   Cardiovascular:     Rate and Rhythm: Normal rate.  Pulmonary:     Effort: Pulmonary effort is normal.  Musculoskeletal:     Cervical back: Normal range of motion.  Skin:    Comments: Numerous, small, superficial cut-like lesions noted on patient's bilateral hand/knuckles with no signs of active bleeding, inflammation, drainage, discharge, or infection noted.   Neurological:     Mental Status: He is alert and oriented to person, place, and time.   Review of Systems  Constitutional: Negative.   HENT: Negative.    Eyes: Negative.   Cardiovascular: Negative.   Gastrointestinal: Negative.   Genitourinary: Negative.   Musculoskeletal: Negative.   Neurological: Negative.   Endo/Heme/Allergies: Negative.   Blood pressure (!) 150/97, pulse 89, temperature 98.7 F (37.1 C), temperature source Oral, resp. rate 18, SpO2 99 %. There is no height or weight on file to calculate BMI.  Treatment Plan Summary: Patient recommended for inpatient  psychiatric treatment. IVC initiated as patient has become agitated and trying to leave the facility.    Layla Barter, NP 04/20/2021 5:11 PM

## 2021-04-20 NOTE — ED Notes (Signed)
Pt was given a snack and milk.

## 2021-04-20 NOTE — Progress Notes (Signed)
°   04/20/21 0149  Patient Reported Information  How Did You Hear About [place]? Family/Friend  What Is the Reason for Your Visit/Call Today? Pt reports, he met another pt maybe he's loosing his mind but he believes someone else is posing as a childhood friend he had a crush on. Pt report, she pops up everywhere and she might be in here ([facility]). Pt reports, the walls are closing in and he's getting smaller and smaller. Pt reports, he's suicidal with a plan to drive his car into a wall or buy a gun and shoot himself.  During the assessment would say, "I want to kill myself." Pt denies substance besides tobacco.  How Long Has This Been Causing You Problems? 1 wk - 1 month  What Do You Feel Would Help You the Most Today? Treatment for Depression or other mood problem  Have You Recently Had Any Thoughts About Hurting Yourself? Yes  Are You Planning to Commit Suicide/Harm Yourself At This time? Yes  Have you Recently Had Thoughts About Hurting Someone [place] Dawn? No  Are You Planning To Harm Someone At This Time? No  Have You Used Any Alcohol or Drugs in the Past 24 Hours? No  Do You Currently Have a Therapist/Psychiatrist? No  [facility] Screening Triage Referral Assessment  Type of Contact Face-to-Face  Location of Assessment [facility] Assessment Services  Provider location [facility] Assessment Services  Collateral Involvement Pt denies, having supports.  Patient Determined To Be At Risk for Harm To Self or Others Based on Review of Patient Reported Information or Presenting Complaint? Yes, for Self-Harm  Does Patient Present under Involuntary Commitment? No  [place] of Residence [place]  Patient Currently Receiving the Following Services: Not Receiving Services  Determination of Need Urgent (48 hours)  Options For Referral Medication Management;Inpatient Hospitalization;BH Urgent Care     Vertell Novak, MS, [facility] Triage Specialist [phone]  Determination of need: Urgent.

## 2021-04-21 ENCOUNTER — Encounter (HOSPITAL_COMMUNITY): Payer: Self-pay | Admitting: Emergency Medicine

## 2021-04-21 LAB — POCT URINE DRUG SCREEN - MANUAL ENTRY (I-SCREEN)
POC Amphetamine UR: NOT DETECTED
POC Buprenorphine (BUP): NOT DETECTED
POC Cocaine UR: NOT DETECTED
POC Methadone UR: NOT DETECTED

## 2021-04-21 LAB — LIPID PANEL
Cholesterol: 157 mg/dL (ref 0–200)
HDL: 52 mg/dL (ref >40)
LDL Cholesterol: 90 mg/dL (ref 0–99)
Total CHOL/HDL Ratio: 3 RATIO
Triglycerides: 76 mg/dL (ref <150)
VLDL: 15 mg/dL (ref 0–40)

## 2021-04-21 LAB — POCT URINE DRUG SCREEN - MANUAL ENTRY (I-CUP)
POC Marijuana UR: POSITIVE — AB
POC Methamphetamine UR: NOT DETECTED
POC Morphine: NOT DETECTED
POC Oxazepam (BZO): POSITIVE — AB
POC Oxycodone UR: NOT DETECTED
POC Secobarbital (BAR): NOT DETECTED

## 2021-04-21 LAB — COMPREHENSIVE METABOLIC PANEL
ALT: 22 U/L (ref 0–44)
AST: 24 U/L (ref 15–41)
Albumin: 4 g/dL (ref 3.5–5.0)
Alkaline Phosphatase: 55 U/L (ref 38–126)
Anion gap: 7 (ref 5–15)
BUN: 13 mg/dL (ref 6–20)
CO2: 30 mmol/L (ref 22–32)
Calcium: 9.6 mg/dL (ref 8.9–10.3)
Chloride: 103 mmol/L (ref 98–111)
Creatinine, Ser: 0.82 mg/dL (ref 0.61–1.24)
GFR, Estimated: >60 mL/min (ref >60)
Glucose, Bld: 93 mg/dL (ref 70–99)
Potassium: 4.4 mmol/L (ref 3.5–5.1)
Sodium: 140 mmol/L (ref 135–145)
Total Bilirubin: 0.6 mg/dL (ref 0.3–1.2)
Total Protein: 6.9 g/dL (ref 6.5–8.1)

## 2021-04-21 LAB — CBC WITH DIFFERENTIAL/PLATELET
Abs Immature Granulocytes: 0.03 10*3/uL (ref 0.00–0.07)
Basophils Absolute: 0.1 10*3/uL (ref 0.0–0.1)
Basophils Relative: 1 %
Eosinophils Absolute: 0.2 10*3/uL (ref 0.0–0.5)
Eosinophils Relative: 3 %
HCT: 50.7 % (ref 39.0–52.0)
Hemoglobin: 16.9 g/dL (ref 13.0–17.0)
Immature Granulocytes: 0 %
Lymphocytes Relative: 29 %
Lymphs Abs: 2 10*3/uL (ref 0.7–4.0)
MCH: 29.9 pg (ref 26.0–34.0)
MCHC: 33.3 g/dL (ref 30.0–36.0)
MCV: 89.7 fL (ref 80.0–100.0)
Monocytes Absolute: 0.4 10*3/uL (ref 0.1–1.0)
Monocytes Relative: 6 %
Neutro Abs: 4.2 10*3/uL (ref 1.7–7.7)
Neutrophils Relative %: 61 %
Platelets: 216 10*3/uL (ref 150–400)
RBC: 5.65 MIL/uL (ref 4.22–5.81)
RDW: 13.1 % (ref 11.5–15.5)
WBC: 6.9 10*3/uL (ref 4.0–10.5)
nRBC: 0 % (ref 0.0–0.2)

## 2021-04-21 LAB — HEMOGLOBIN A1C
Hgb A1c MFr Bld: 4.7 % — ABNORMAL LOW (ref 4.8–5.6)
Mean Plasma Glucose: 88.19 mg/dL

## 2021-04-21 LAB — ETHANOL: Alcohol, Ethyl (B): <10 mg/dL (ref <10)

## 2021-04-21 LAB — TSH: TSH: 0.919 u[IU]/mL (ref 0.350–4.500)

## 2021-04-21 MED ORDER — OLANZAPINE 5 MG PO TABS
5.0000 mg | ORAL_TABLET | Freq: Every day | ORAL | Status: DC
  Administered 2021-04-21 – 2021-04-22 (×2): 5 mg via ORAL
  Filled 2021-04-21 (×2): qty 1

## 2021-04-21 NOTE — ED Notes (Signed)
Pt asleep with even and unlabored respirations. No distress or discomfort noted. Pt remains safe on the unit. Will continue to monitor. 

## 2021-04-21 NOTE — ED Notes (Signed)
Pt sleeping@this time. Breathing even and unlabored. Will continue to monitor for safety 
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
## 2021-04-21 NOTE — ED Notes (Addendum)
[facility] called to collect STAT specimens and to deliver to [facility]. ?

## 2021-04-21 NOTE — ED Notes (Signed)
Pt sleeping at present, no distress noted, respirations even & unlabored.  Monitoring for safety. ?

## 2021-04-21 NOTE — ED Notes (Signed)
Pt in adult side of continuous assessment sleeping with no acute signs of distress noted. Respirations even and unlabored. Will continue to monitor for safety. °

## 2021-04-21 NOTE — ED Notes (Signed)
Pt's mom called wanting to speak to the provider. Informed provider via secure chat. Provider responded that he would call the mom back in 15 min. Informed the mom of the response.
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
## 2021-04-21 NOTE — ED Notes (Signed)
Pt allowed this nurse to collect lab specimens, complete EKG and collect urine sample per orders. Pt requested for this nurse to  not make him return to the observation unit while in the hallway after completing the EKG, but did return without any adverse behaviors. Safety maintained and will continue to monitor.

## 2021-04-21 NOTE — ED Notes (Signed)
Pt becoming agitated on the unit. Pt talking loudly to himself and saying "I''m not going to fucking kill myself." Pt offered medication. Ativan 2 mg, PRN given.

## 2021-04-21 NOTE — ED Notes (Addendum)
Pt stated, "I am ready to go and I am voluntary". Pt is not voluntary. Informed provider via secure chat. Safety maintained and will continue to monitor.

## 2021-04-21 NOTE — Progress Notes (Signed)
Inpatient Behavioral Health  Pt meets inpatient criteria per Liborio Nixon, NP. There are no appropriate beds at [facility] per [facility], RN.  Referral was sent to the following facilities;   Destination Service Provider Address Phone Fax  [facility]  [address] [place]., [place] [address] [phone]  [facility]  [address] [place] [address] [phone]  [facility]  [address] [place] [address] [phone]  [facility]  [address] [place] [address] [phone]  [facility]  [address] [place], [place] [address] [phone]  [facility]  [address] [place], [place] [address] [phone]  [facility]  [address] [place] [address] [phone]  [facility]  [address], [place] [address] [phone]  [facility]  [address], [place] [address] [phone]  [facility]  [address] [place] [address] [phone]  [facility]  [address] [place] [address] [phone]  [facility]  [address] [place], [place] [address] [phone]  [facility]  [address], [place] [address] [phone]    Situation ongoing,  CSW will follow up.   Maryjean Ka, MSW, LCSWA 04/21/2021  @ 12:23 AM

## 2021-04-21 NOTE — Progress Notes (Signed)
CSW received a phone call from [facility] requesting the following:Treatment history, CBC, CMB, UDS, BAL. Fax to [phone]. 1 shift CSW to follow up.  Maryjean Ka, MSW, LCSWA 04/21/2021 2:04 AM

## 2021-04-21 NOTE — Progress Notes (Signed)
CSW followed-up with Suzanne from [facility] in reference to a referral sent for this patient. It was reported that this patient is pending review.  ° °Alliene Klugh, MSW, LCSW-A, LCAS-A °Phone: [phone] °Disposition/TOC °

## 2021-04-21 NOTE — Progress Notes (Signed)
Per Elpidio Galea, patient meets criteria for inpatient treatment. There are no available or appropriate beds at [facility] today. CSW re-faxed referrals to the following facilities for review:  Destination Service Provider Address Phone Fax  [facility]  [place] [place] [address] [phone] [place]  [address], [place] [address] [phone]  [facility]  [address], [place] [address] [phone] [place]  [address] [place] [address] [phone]  [facility]  [place], [place] [address] [phone]  [facility]  [place], [place] [address] [place]  [facility]  [address] [place] [address] [phone] [facility]  [address] [place] [address] [phone]  [facility]  [address], [place] [address] [phone]  [facility]  [address] [place] [address] [phone]  [facility]  [place], [place] [address] [phone]  [facility]  [address] [place], [place] [address] [phone] [facility]  [address], [place] [address] [id number]      TTS will continue to seek bed placement.  Glennie [place], MSW, [place], LCAS-A Phone: [phone] Disposition/TOC

## 2021-04-22 MED ORDER — OLANZAPINE 5 MG PO TBDP
5.0000 mg | ORAL_TABLET | Freq: Every day | ORAL | Status: DC
  Administered 2021-04-22: 5 mg via ORAL
  Filled 2021-04-22: qty 1

## 2021-04-22 NOTE — Progress Notes (Signed)
Per Liborio Nixon, NP, patient meets criteria for inpatient treatment. There are no available or appropriate beds at [facility] today. CSW faxed referrals to the following facilities for review:  [facility]  Pending - Request Sent N/A [address], [place] [address] [phone] --  [facility]  Pending - Request Sent N/A [address] [place] [address] [phone] --  [facility]  Pending - Request Sent N/A [address] [place] [address] [phone] --  [facility]  Pending - Request Sent N/A [address] [place] [address] [phone] --  [facility]  Pending - Request Sent N/A [address] [place], [place] [address] [phone] --  [facility]  Pending - Request Sent N/A [address] [place], [place] [address] [phone] --  [facility]  Pending - Request Sent N/A [address] [place] [address] [phone] --  [facility]  Pending - Request Sent N/A [address] [place] [address] [phone] --  [facility]  Pending - Request Sent N/A [address], [place] [address] [phone] --  [facility]  Pending - Request Sent N/A [address] [place] [address] [phone] --  [facility]  Pending - Request Sent N/A [address] [place] [address] [phone] --  [facility]  Pending - Request Sent N/A [address] [place], [place] [address] [phone] --  [facility]  Pending - Request Sent N/A [address] [place] [address] [phone] --  [facility]   Pending - Request Sent N/A [address] [place] [address] [phone] --  [facility]  Pending - Request Sent N/A [address], [place] [address] [phone] --  [facility]  Pending - Request Sent N/A [address], [place] [address] [phone] --  [facility]  Pending - Request Sent N/A [address], [place] [address] [phone] --   TTS will continue to seek bed placement.  Crissie Reese, MSW, LCSW-A, LCAS-A Phone: [phone] Disposition/TOC

## 2021-04-22 NOTE — ED Notes (Signed)
Pt is requesting to go home. Stated, "When can I leave here?". Informed provider via secure chat.

## 2021-04-22 NOTE — ED Notes (Signed)
Pt sleeping at present, no distress noted. Respirations even & unlabored.  Monitoring for safety. 

## 2021-04-22 NOTE — ED Provider Notes (Addendum)
Behavioral Health Progress Note  Date and Time: 04/22/2021 11:49 AM Name: Patrick Washington MRN:  [id number]  Subjective: Per TTS counselor, it was reported by patient to TTS counselor during TTS evaluation that the patient was actively suicidal with plan to buy a gun and shoot himself or drive his vehicle into a wall and patient also reported to TTS counselor multiple times during TTS evaluation that he wanted to kill himself (See Odetta Pink, 04/20/21 TTS note for further details if necessary).    Patient seen and evaluated face-to-face by this provider, chart reviewed and case discussed with Dr. Lovette Cliche.On evaluation, patient is alert and oriented x4. His thought process is scattered with delusional thought content. His mood is depressed and affect is congruent.  Patient denies suicidal ideations. Patient denies homicidal ideations. Patient denies auditory and visual hallucinations. There is no objective evidence that the patient is currently responding to internal or external stimuli.  Patient continues to ruminate about his friend Janett Billow who he continues to look for. Patient states that she would show up, change her hair, but she is the same person. He states, I know I sounds crazy. He states that he saw her walking down the road 2 days ago when he got out of jail. He states that he continues to look for Janett Billow because she is the only person that he loves, and that he loves her more than his mother. He states that he is afraid she won't like him and that she is the best things that has ever happened to him. Patient began crying while conveying his feelings for Janett Billow. He states that Janett Billow will pop up a couple times here and there. He endorses depressive symptoms of sadness, hopelessness, worthlessness, crying spells, irritability, and anhedonia. 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 When asked  when was the last time he worked, he states two weeks ago. When asked if he was fired, he states yes. When asked why was he fired, he states that his coworker told him that if you want a woman you have to pull her hair, he states that he was fired for assaulting the girls at his job.    Diagnosis:  Final diagnoses:  MDD (major depressive disorder), single episode, severe ([place])  Suicidal ideation    Total Time spent with patient: 15 minutes  Past Psychiatric History: Patient denies history of any previous inpatient psychiatric hospitalizations.  Per chart review, patient recently presented to the [facility] on [facility], did not meet criteria for inpatient psychiatric treatment at that time, and was discharged with outpatient resources for psychotropic medication management, therapy, and substance abuse treatment.  Chart review also shows that patient presented to [facility] emergency department on 07/13/2020 via EMS due to being found by law enforcement naked in a parking deck, exhibiting odd behaviors.  Per chart review, patient was placed in four-point restraint during this 07/13/2020 to the ED evaluation and patient reported that he ingested mushrooms at the time of this ED evaluation as well.  Per chart review, patient was under IVC during this 07/13/2020 ED encounter, but patient's IVC was ultimately rescinded and patient was discharged from the ED as it was documented that patient did not appear to be psychotic, patient's behaviors were determined to be likely related to use of substances, and patient was not suicidal at that time.  Of note, patient's 07/13/20 UDS  was positive for THC. Past Medical History: History reviewed. No pertinent past medical history.  Past Surgical History:  Procedure Laterality Date   TONSILLECTOMY     Family History: History reviewed. No pertinent family history. Family Psychiatric  History: No hx reported  Social History:  Social History   Substance and Sexual Activity   Alcohol Use No     Social History   Substance and Sexual Activity  Drug Use No    Social History   Socioeconomic History   Marital status: Single    Spouse name: Not on file   Number of children: Not on file   Years of education: Not on file   Highest education level: Not on file  Occupational History   Not on file  Tobacco Use   Smoking status: Unknown   Smokeless tobacco: Not on file  Substance and Sexual Activity   Alcohol use: No   Drug use: No   Sexual activity: Not on file  Other Topics Concern   Not on file  Social History Narrative   Not on file   Social Determinants of Health   Financial Resource Strain: Not on file  Food Insecurity: Not on file  Transportation Needs: Not on file  Physical Activity: Not on file  Stress: Not on file  Social Connections: Not on file   SDOH:  SDOH Screenings   Alcohol Screen: Not on file  Depression (PHQ2-9): Not on file  Financial Resource Strain: Not on file  Food Insecurity: Not on file  Housing: Not on file  Physical Activity: Not on file  Social Connections: Not on file  Stress: Not on file  Tobacco Use: Not on file  Transportation Needs: Not on file   Additional Social History:    Pain Medications: See MAR Prescriptions: See MAR Over the Counter: See MAR   Current Medications:  Current Facility-Administered Medications  Medication Dose Route Frequency Provider Last Rate Last Admin   acetaminophen (TYLENOL) tablet 650 mg  650 mg Oral Q6H PRN Prescilla Sours, PA-C       alum & mag hydroxide-simeth (MAALOX/MYLANTA) 200-200-20 MG/5ML suspension 30 mL  30 mL Oral Q4H PRN Margorie John W, PA-C       diphenhydrAMINE (BENADRYL) capsule 50 mg  50 mg Oral Q8H PRN Margorie John W, PA-C   50 mg at 04/21/21 2130   Or   diphenhydrAMINE (BENADRYL) injection 50 mg  50 mg Intramuscular Q8H PRN Margorie John W, PA-C   50 mg at 04/20/21 1923   haloperidol (HALDOL) tablet 5 mg  5 mg Oral Q8H PRN Prescilla Sours, PA-C       Or    haloperidol lactate (HALDOL) injection 5 mg  5 mg Intramuscular Q8H PRN Margorie John W, PA-C   5 mg at 04/20/21 1924   hydrOXYzine (ATARAX) tablet 25 mg  25 mg Oral TID PRN Prescilla Sours, PA-C       LORazepam (ATIVAN) tablet 2 mg  2 mg Oral Q8H PRN Margorie John W, PA-C   2 mg at 04/21/21 2129   Or   LORazepam (ATIVAN) injection 2 mg  2 mg Intramuscular Q8H PRN Prescilla Sours, PA-C   2 mg at 04/20/21 1923   magnesium hydroxide (MILK OF MAGNESIA) suspension 30 mL  30 mL Oral Daily PRN Margorie John W, PA-C       nicotine (NICODERM CQ - dosed in mg/24 hours) patch 14 mg  14 mg Transdermal Daily Prescilla Sours, PA-C  14 mg at 04/22/21 1000   OLANZapine (ZYPREXA) tablet 5 mg  5 mg Oral QHS Corky Sox, MD   5 mg at 04/21/21 2130   OLANZapine zydis (ZYPREXA) disintegrating tablet 5 mg  5 mg Oral Daily Lashena Signer L, NP       traZODone (DESYREL) tablet 50 mg  50 mg Oral QHS PRN Prescilla Sours, PA-C   50 mg at 04/21/21 2130   No current outpatient medications on file.    Labs  Lab Results:  Admission on 04/20/2021  Component Date Value Ref Range Status   SARS Coronavirus 2 by RT PCR 04/20/2021 NEGATIVE  NEGATIVE Final   Comment: (NOTE) SARS-CoV-2 target nucleic acids are NOT DETECTED.  The SARS-CoV-2 RNA is generally detectable in upper respiratory specimens during the acute phase of infection. The lowest concentration of SARS-CoV-2 viral copies this assay can detect is 138 copies/mL. A negative result does not preclude SARS-Cov-2 infection and should not be used as the sole basis for treatment or other patient management decisions. A negative result may occur with  improper specimen collection/handling, submission of specimen other than nasopharyngeal swab, presence of viral mutation(s) within the areas targeted by this assay, and inadequate number of viral copies(<138 copies/mL). A negative result must be combined with clinical observations, patient history, and  epidemiological information. The expected result is Negative.  Fact Sheet for Patients:  EntrepreneurPulse.com.au  Fact Sheet for Healthcare Providers:  IncredibleEmployment.be  This test is no                          t yet approved or cleared by the [place] FDA and  has been authorized for detection and/or diagnosis of SARS-CoV-2 by FDA under an Emergency Use Authorization (EUA). This EUA will remain  in effect (meaning this test can be used) for the duration of the COVID-19 declaration under Section 564(b)(1) of the Act, 21 U.S.C.section 360bbb-3(b)(1), unless the authorization is terminated  or revoked sooner.       Influenza A by PCR 04/20/2021 NEGATIVE  NEGATIVE Final   Influenza B by PCR 04/20/2021 NEGATIVE  NEGATIVE Final   Comment: (NOTE) The Xpert Xpress SARS-CoV-2/FLU/RSV plus assay is intended as an aid in the diagnosis of influenza from Nasopharyngeal swab specimens and should not be used as a sole basis for treatment. Nasal washings and aspirates are unacceptable for Xpert Xpress SARS-CoV-2/FLU/RSV testing.  Fact Sheet for Patients: EntrepreneurPulse.com.au  Fact Sheet for Healthcare Providers: IncredibleEmployment.be  This test is not yet approved or cleared by the [place] FDA and has been authorized for detection and/or diagnosis of SARS-CoV-2 by FDA under an Emergency Use Authorization (EUA). This EUA will remain in effect (meaning this test can be used) for the duration of the COVID-19 declaration under Section 564(b)(1) of the Act, 21 U.S.C. section 360bbb-3(b)(1), unless the authorization is terminated or revoked.  Performed at [facility], [place] [address], [place], [place] [address]    SARS Coronavirus 2 Ag 04/20/2021 Negative  Negative Preliminary   Hgb A1c MFr Bld 04/21/2021 4.7 (L)  4.8 - 5.6 % Final   Comment: (NOTE) Pre diabetes:           5.7%-6.4%  Diabetes:              >6.4%  Glycemic control for   <7.0% adults with diabetes    Mean Plasma Glucose 04/21/2021 88.19  mg/dL Final   Performed at [facility]  [facility], [address] [place],  [address]   Alcohol, Ethyl (B) 04/21/2021 <10  <10 mg/dL Final   Comment: (NOTE) Lowest detectable limit for serum alcohol is 10 mg/dL.  For medical purposes only. Performed at [facility], [place] [address] [place], [place] [address]    Cholesterol 04/21/2021 157  0 - 200 mg/dL Final   Triglycerides 04/21/2021 76  <150 mg/dL Final   HDL 04/21/2021 52  >40 mg/dL Final   Total CHOL/HDL Ratio 04/21/2021 3.0  RATIO Final   VLDL 04/21/2021 15  0 - 40 mg/dL Final   LDL Cholesterol 04/21/2021 90  0 - 99 mg/dL Final   Comment:        Total Cholesterol/HDL:CHD Risk Coronary Heart Disease Risk Table                     Men   Women  1/2 Average Risk   3.4   3.3  Average Risk       5.0   4.4  2 X Average Risk   9.6   7.1  3 X Average Risk  23.4   11.0        Use the calculated Patient Ratio above and the CHD Risk Table to determine the patient's CHD Risk.        ATP III CLASSIFICATION (LDL):  <100     mg/dL   Optimal  100-129  mg/dL   Near or Above                    Optimal  130-159  mg/dL   Borderline  160-189  mg/dL   High  >190     mg/dL   Very High Performed at [facility] [address] [place], [place] [address]    TSH 04/21/2021 0.919  0.350 - 4.500 uIU/mL Final   Comment: Performed by a 3rd Generation assay with a functional sensitivity of <=0.01 uIU/mL. Performed at [facility], [place] [address] [place], [place] [address]    POC Amphetamine UR 04/21/2021 None Detected  NONE DETECTED (Cut Off Level 1000 ng/mL) Final   POC Secobarbital (BAR) 04/21/2021 None Detected  NONE DETECTED (Cut Off Level 300 ng/mL) Final   POC Buprenorphine (BUP) 04/21/2021 None Detected  NONE DETECTED (Cut Off Level 10 ng/mL) Final   POC Oxazepam (BZO) 04/21/2021  Positive (A)  NONE DETECTED (Cut Off Level 300 ng/mL) Final   POC Cocaine UR 04/21/2021 None Detected  NONE DETECTED (Cut Off Level 300 ng/mL) Final   POC Methamphetamine UR 04/21/2021 None Detected  NONE DETECTED (Cut Off Level 1000 ng/mL) Final   POC Morphine 04/21/2021 None Detected  NONE DETECTED (Cut Off Level 300 ng/mL) Final   POC Oxycodone UR 04/21/2021 None Detected  NONE DETECTED (Cut Off Level 100 ng/mL) Final   POC Methadone UR 04/21/2021 None Detected  NONE DETECTED (Cut Off Level 300 ng/mL) Final   POC Marijuana UR 04/21/2021 Positive (A)  NONE DETECTED (Cut Off Level 50 ng/mL) Final   WBC 04/21/2021 6.9  4.0 - 10.5 K/uL Final   RBC 04/21/2021 5.65  4.22 - 5.81 MIL/uL Final   Hemoglobin 04/21/2021 16.9  13.0 - 17.0 g/dL Final   HCT 04/21/2021 50.7  39.0 - 52.0 % Final   MCV 04/21/2021 89.7  80.0 - 100.0 fL Final   MCH 04/21/2021 29.9  26.0 - 34.0 pg Final   MCHC 04/21/2021 33.3  30.0 - 36.0 g/dL Final  RDW 04/21/2021 13.1  11.5 - 15.5 % Final   Platelets 04/21/2021 216  150 - 400 K/uL Final   nRBC 04/21/2021 0.0  0.0 - 0.2 % Final   Neutrophils Relative % 04/21/2021 61  % Final   Neutro Abs 04/21/2021 4.2  1.7 - 7.7 K/uL Final   Lymphocytes Relative 04/21/2021 29  % Final   Lymphs Abs 04/21/2021 2.0  0.7 - 4.0 K/uL Final   Monocytes Relative 04/21/2021 6  % Final   Monocytes Absolute 04/21/2021 0.4  0.1 - 1.0 K/uL Final   Eosinophils Relative 04/21/2021 3  % Final   Eosinophils Absolute 04/21/2021 0.2  0.0 - 0.5 K/uL Final   Basophils Relative 04/21/2021 1  % Final   Basophils Absolute 04/21/2021 0.1  0.0 - 0.1 K/uL Final   Immature Granulocytes 04/21/2021 0  % Final   Abs Immature Granulocytes 04/21/2021 0.03  0.00 - 0.07 K/uL Final   Performed at [facility], [place] [address], [place], [place] [address]   Sodium 04/21/2021 140  135 - 145 mmol/L Final   Potassium 04/21/2021 4.4  3.5 - 5.1 mmol/L Final   Chloride 04/21/2021 103  98 - 111 mmol/L Final   CO2  04/21/2021 30  22 - 32 mmol/L Final   Glucose, Bld 04/21/2021 93  70 - 99 mg/dL Final   Glucose reference range applies only to samples taken after fasting for at least 8 hours.   BUN 04/21/2021 13  6 - 20 mg/dL Final   Creatinine, Ser 04/21/2021 0.82  0.61 - 1.24 mg/dL Final   Calcium 04/21/2021 9.6  8.9 - 10.3 mg/dL Final   Total Protein 04/21/2021 6.9  6.5 - 8.1 g/dL Final   Albumin 04/21/2021 4.0  3.5 - 5.0 g/dL Final   AST 04/21/2021 24  15 - 41 U/L Final   ALT 04/21/2021 22  0 - 44 U/L Final   Alkaline Phosphatase 04/21/2021 55  38 - 126 U/L Final   Total Bilirubin 04/21/2021 0.6  0.3 - 1.2 mg/dL Final   GFR, Estimated 04/21/2021 >60  >60 mL/min Final   Comment: (NOTE) Calculated using the CKD-EPI Creatinine Equation (2021)    Anion gap 04/21/2021 7  5 - 15 Final   Performed at [facility], [place] [address], [place], [place] [address]    Blood Alcohol level:  Lab Results  Component Value Date   [facility] <10 04/21/2021   ETH <10 123XX123    Metabolic Disorder Labs: Lab Results  Component Value Date   HGBA1C 4.7 (L) 04/21/2021   MPG 88.19 04/21/2021   No results found for: PROLACTIN Lab Results  Component Value Date   CHOL 157 04/21/2021   TRIG 76 04/21/2021   HDL 52 04/21/2021   CHOLHDL 3.0 04/21/2021   VLDL 15 04/21/2021   LDLCALC 90 04/21/2021    Therapeutic Lab Levels: No results found for: LITHIUM No results found for: VALPROATE No components found for:  CBMZ  Physical Findings     Musculoskeletal  Strength & Muscle Tone: within normal limits Gait & Station: normal Patient leans: N/A  Psychiatric Specialty Exam  Presentation  General Appearance: Disheveled  Eye Contact:Minimal  Speech:Clear and Coherent  Speech Volume:Normal  Handedness:Right   Mood and Affect  Mood:Depressed  Affect:Congruent   Thought Process  Thought Processes:Disorganized  Descriptions of Associations:Circumstantial  Orientation:Full (Time, Place and  Person)  Thought Content:Scattered; Rumination  Diagnosis of Schizophrenia or Schizoaffective disorder in past: No  Duration of Psychotic  Symptoms: Less than six months   Hallucinations:Hallucinations: None  Ideas of Reference:Delusions  Suicidal Thoughts:Suicidal Thoughts: No  Homicidal Thoughts:Homicidal Thoughts: No   Sensorium  Memory:Immediate Fair; Recent Fair; Remote Fair  Judgment:Poor  Insight:Lacking   Executive Functions  Concentration:Fair  Attention Span:Fair  [place]   Psychomotor Activity  Psychomotor Activity:Psychomotor Activity: Normal   Assets  Assets:Communication Skills; Leisure Time; Physical Health; Resilience   Sleep  Sleep:Sleep: Fair  Physical Exam  Physical Exam Constitutional:      Appearance: Normal appearance.  HENT:     Head: Normocephalic and atraumatic.     Nose: Nose normal.  Cardiovascular:     Rate and Rhythm: Normal rate.  Pulmonary:     Effort: Pulmonary effort is normal.  Musculoskeletal:     Cervical back: Normal range of motion.  Neurological:     Mental Status: He is alert and oriented to person, place, and time.   Review of Systems  Constitutional: Negative.   HENT: Negative.    Eyes: Negative.   Respiratory: Negative.    Cardiovascular: Negative.   Gastrointestinal: Negative.   Genitourinary: Negative.   Musculoskeletal: Negative.   Neurological: Negative.   Blood pressure 130/89, pulse 75, temperature 98.3 F (36.8 C), temperature source Oral, resp. rate 15, SpO2 100 %. There is no height or weight on file to calculate BMI.  Treatment Plan Summary: Patient recommended for inpatient psychiatric treatment. Patient is under IVC. No beds at [facility]. Patient faxed out for inpatient psychiatric treatment by CSW.   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

## 2021-04-22 NOTE — ED Notes (Signed)
Pt sleeping at present.  No distress noted.  Monitoring for safety. 

## 2021-04-22 NOTE — ED Notes (Signed)
Pt sleeping at present, no distress noted.  Monitoring for safety. 

## 2021-04-22 NOTE — Progress Notes (Addendum)
Pt at nurses' station asking to leave. Pt reports he is getting "mad and agitated" and wants to leave this "fucking place." Pt also yelling and cursing on phone on the unit and slamming phone down. Pt requested medication. Pt offered PRN meds. Pt was given Benadryl 50 mg, IM, PRN and Haldol 5 mg, IM, PRN were given to pt.

## 2021-04-22 NOTE — Progress Notes (Signed)
CSW received call from [facility] in reference to reviewing this patient. It was reported that the admission nurse will be notified that the patient still needs review for placement.   Glennie Isle, MSW, [place], LCAS-A Phone: [phone] Disposition/TOC

## 2021-04-22 NOTE — ED Notes (Signed)
Pt resting on pull out bed in observation area. No s&s of distress observed. Safety maintained and will continue to monitor.  °

## 2021-04-23 MED ORDER — NICOTINE 21 MG/24HR TD PT24
21.0000 mg | MEDICATED_PATCH | Freq: Every day | TRANSDERMAL | Status: DC
Start: 2021-04-23 — End: 2021-04-24
  Administered 2021-04-23: 21 mg via TRANSDERMAL
  Filled 2021-04-23: qty 1

## 2021-04-23 MED ORDER — DIVALPROEX SODIUM 500 MG PO DR TAB
500.0000 mg | DELAYED_RELEASE_TABLET | Freq: Two times a day (BID) | ORAL | Status: DC
  Administered 2021-04-23 – 2021-04-24 (×2): 500 mg via ORAL
  Filled 2021-04-23 (×3): qty 1

## 2021-04-23 MED ORDER — OLANZAPINE 10 MG PO TABS
10.0000 mg | ORAL_TABLET | Freq: Every day | ORAL | Status: DC
  Filled 2021-04-23: qty 1

## 2021-04-23 MED ORDER — NICOTINE 21 MG/24HR TD PT24
MEDICATED_PATCH | TRANSDERMAL | Status: AC
  Filled 2021-04-23: qty 1

## 2021-04-23 NOTE — ED Notes (Signed)
Pt was given cheeze it.

## 2021-04-23 NOTE — ED Notes (Signed)
Pt was given hot meal for lunch

## 2021-04-23 NOTE — ED Notes (Signed)
Pt began yelling, "I want a fucking cigarette. I can go outside. I can go outside. I can go outside. I can go to my fucking car to smoke a cigarette". Explained to Pt that he refused his nicotine patch this morning and that he can't smoke on [facility] property. Security present and began talking to Pt.

## 2021-04-23 NOTE — ED Notes (Signed)
Pt awake & resting at present, no distress noted.  Monitoring for safety. 

## 2021-04-23 NOTE — ED Notes (Signed)
Pt requested to have the numbers for his parents. Numbers provided. Pt called his mom and began yelling and cursing and then slammed the phone down. The phone was then turned off. Staff informed Pt to not call anyone that was going to get him this upset again. Safety maintained and will continue to monitor.

## 2021-04-23 NOTE — ED Notes (Signed)
Patient took a shower at the beginning of the shift. It had been a few days since patient had taken a bath. So far this shift patient has been cooperative and calm. He asked for a muffin, which he was given. He is interacting with other patients. He is also asking about when he will be going to [facility] facility. Will continue to monitor for safety.

## 2021-04-23 NOTE — ED Notes (Signed)
Patient cooperated to allow nurse to collect COVID-19 PCR test for lab test. Swab was done with no problems or complaints.[employer] was contacted to pick up the sample.

## 2021-04-23 NOTE — Progress Notes (Addendum)
Pt accepted to [facility] 04/24/21 PENDING negative COIVD-19 results after 10:00am.   Care Team notified: Carlyn Reichert, MD, and Laretta Alstrom, LPN.   Maryjean Ka, MSW, [facility] 04/23/2021 4:48 PM

## 2021-04-23 NOTE — ED Notes (Signed)
Pt pacing back and fourth. Stating "I hope they let me out today".

## 2021-04-23 NOTE — Progress Notes (Signed)
Patient has been denied by [facility] due to no appropriate beds available. Patient meets BH inpatient criteria Patrick Nixon, NP. Patient has been faxed out to the following facilities:     [facility]  [address] [place] [address] [phone]  [facility]  [address] [place] [address] [phone]  [facility]  [address], [place] [address] [phone]  [facility]  [address] [place] [address] [phone]  [facility]  [address] [place], [place] [address] [phone]  [facility]  [address] [place], [place] [address] [phone]  [facility]  [address] [place] [address] [phone]  [facility]  [address] [place] [address] [phone]  [facility]  [address], [place] [address] [phone]  [facility]  [address], [place] [address] [phone]  [facility]  [address] [place] [address] [phone]  [place]  [address] [place], [place] [address] [phone]  [facility]  [address] [place] [address] [phone]  [facility]  [address] [place] [address] [phone]  [facility] [address] [place]  [address], [place] [address] [phone]  [facility]  [address] [place] [address] [phone]  [facility]  [address], [place] [address] [phone]   Damita Dunnings, MSW, LCSW-A  3:03 PM  04/23/2021

## 2021-04-23 NOTE — ED Notes (Signed)
Pt was given peanut butter and graham crackers

## 2021-04-23 NOTE — ED Provider Notes (Signed)
Behavioral Health Progress Note  Date and Time: 04/23/2021 3:51 PM Name: Patrick Washington MRN:  [id number]  Subjective:   Patrick Washington is a 30 year old male with no documented significant past psychiatric history who presents to the [facility] behavioral health urgent care [facility]) unaccompanied via [employer] as a voluntary walk-in.  Patient states that he came to the [facility] because "I figured she might be here".  The patient has been found to be delusional and has stated suicidal thoughts to multiple providers. He is currently involuntarily admitted to the [facility] obs unit.   Of note, the patient received as needed Haldol and Benadryl this morning for agitation.  On interview and assessment this morning, the patient appears similar to previous days.  He is delusional and obsessional with scattered thought process.  He still believes that he has a job at the [employer] (which his mom has stated is ridiculous).  He makes odd comments about his mom abandoning him and states "she had loud angry sex with my stepdad".  He continues to perseverate on the person that he "followed in here".  He denies suicidal thoughts, homicidal thoughts, and auditory/visual hallucinations.  He reports that his mood is "good".  He reports good sleep and appetite and denies any physical problems.  Review of systems as below.   Diagnosis:  Final diagnoses:  MDD (major depressive disorder), single episode, severe ([place])  Suicidal ideation    Total Time spent with patient: 20 minutes  Past Psychiatric History: none documented Past Medical History: History reviewed. No pertinent past medical history.  Past Surgical History:  Procedure Laterality Date   TONSILLECTOMY     Family History: History reviewed. No pertinent family history. Family Psychiatric  History: denies Hx of major mental illness Social History:  Social History   Substance and Sexual Activity  Alcohol Use No     Social History   Substance  and Sexual Activity  Drug Use No    Social History   Socioeconomic History   Marital status: Single    Spouse name: Not on file   Number of children: Not on file   Years of education: Not on file   Highest education level: Not on file  Occupational History   Not on file  Tobacco Use   Smoking status: Unknown   Smokeless tobacco: Not on file  Substance and Sexual Activity   Alcohol use: No   Drug use: No   Sexual activity: Not on file  Other Topics Concern   Not on file  Social History Narrative   Not on file   Social Determinants of Health   Financial Resource Strain: Not on file  Food Insecurity: Not on file  Transportation Needs: Not on file  Physical Activity: Not on file  Stress: Not on file  Social Connections: Not on file   SDOH:  SDOH Screenings   Alcohol Screen: Not on file  Depression (PHQ2-9): Not on file  Financial Resource Strain: Not on file  Food Insecurity: Not on file  Housing: Not on file  Physical Activity: Not on file  Social Connections: Not on file  Stress: Not on file  Tobacco Use: Not on file  Transportation Needs: Not on file   Additional Social History:    Pain Medications: See [facility] Prescriptions: See MAR Over the Counter: See MAR                    Sleep: Fair  Appetite:  Fair  Current Medications:  Current Facility-Administered Medications  Medication Dose Route Frequency Provider Last Rate Last Admin   acetaminophen (TYLENOL) tablet 650 mg  650 mg Oral Q6H PRN Prescilla Sours, PA-C       alum & mag hydroxide-simeth (MAALOX/MYLANTA) 200-200-20 MG/5ML suspension 30 mL  30 mL Oral Q4H PRN Margorie John W, PA-C       diphenhydrAMINE (BENADRYL) capsule 50 mg  50 mg Oral Q8H PRN Margorie John W, PA-C   50 mg at 04/21/21 2130   Or   diphenhydrAMINE (BENADRYL) injection 50 mg  50 mg Intramuscular Q8H PRN Margorie John W, PA-C   50 mg at 04/22/21 1523   divalproex (DEPAKOTE) DR tablet 500 mg  500 mg Oral Q12H Corky Sox, MD    500 mg at 04/23/21 1037   haloperidol (HALDOL) tablet 5 mg  5 mg Oral Q8H PRN Prescilla Sours, PA-C       Or   haloperidol lactate (HALDOL) injection 5 mg  5 mg Intramuscular Q8H PRN Margorie John W, PA-C   5 mg at 04/22/21 1523   hydrOXYzine (ATARAX) tablet 25 mg  25 mg Oral TID PRN Prescilla Sours, PA-C       LORazepam (ATIVAN) tablet 2 mg  2 mg Oral Q8H PRN Prescilla Sours, PA-C   2 mg at 04/21/21 2129   Or   LORazepam (ATIVAN) injection 2 mg  2 mg Intramuscular Q8H PRN Prescilla Sours, PA-C   2 mg at 04/22/21 1220   magnesium hydroxide (MILK OF MAGNESIA) suspension 30 mL  30 mL Oral Daily PRN Margorie John W, PA-C       nicotine (NICODERM CQ - dosed in mg/24 hours) 21 mg/24hr patch            nicotine (NICODERM CQ - dosed in mg/24 hours) patch 21 mg  21 mg Transdermal Daily Corky Sox, MD   21 mg at 04/23/21 1255   OLANZapine (ZYPREXA) tablet 10 mg  10 mg Oral QHS Corky Sox, MD       traZODone (DESYREL) tablet 50 mg  50 mg Oral QHS PRN Prescilla Sours, PA-C   50 mg at 04/22/21 2126   No current outpatient medications on file.    Labs  Lab Results:  Admission on 04/20/2021  Component Date Value Ref Range Status   SARS Coronavirus 2 by RT PCR 04/20/2021 NEGATIVE  NEGATIVE Final   Comment: (NOTE) SARS-CoV-2 target nucleic acids are NOT DETECTED.  The SARS-CoV-2 RNA is generally detectable in upper respiratory specimens during the acute phase of infection. The lowest concentration of SARS-CoV-2 viral copies this assay can detect is 138 copies/mL. A negative result does not preclude SARS-Cov-2 infection and should not be used as the sole basis for treatment or other patient management decisions. A negative result may occur with  improper specimen collection/handling, submission of specimen other than nasopharyngeal swab, presence of viral mutation(s) within the areas targeted by this assay, and inadequate number of viral copies(<138 copies/mL). A negative result must be  combined with clinical observations, patient history, and epidemiological information. The expected result is Negative.  Fact Sheet for Patients:  EntrepreneurPulse.com.au  Fact Sheet for Healthcare Providers:  IncredibleEmployment.be  This test is no                          t yet approved or cleared by the [place] FDA and  has been authorized for detection and/or diagnosis  of SARS-CoV-2 by FDA under an Emergency Use Authorization (EUA). This EUA will remain  in effect (meaning this test can be used) for the duration of the COVID-19 declaration under Section 564(b)(1) of the Act, 21 U.S.C.section 360bbb-3(b)(1), unless the authorization is terminated  or revoked sooner.       Influenza A by PCR 04/20/2021 NEGATIVE  NEGATIVE Final   Influenza B by PCR 04/20/2021 NEGATIVE  NEGATIVE Final   Comment: (NOTE) The Xpert Xpress SARS-CoV-2/FLU/RSV plus assay is intended as an aid in the diagnosis of influenza from Nasopharyngeal swab specimens and should not be used as a sole basis for treatment. Nasal washings and aspirates are unacceptable for Xpert Xpress SARS-CoV-2/FLU/RSV testing.  Fact Sheet for Patients: EntrepreneurPulse.com.au  Fact Sheet for Healthcare Providers: IncredibleEmployment.be  This test is not yet approved or cleared by the [place] FDA and has been authorized for detection and/or diagnosis of SARS-CoV-2 by FDA under an Emergency Use Authorization (EUA). This EUA will remain in effect (meaning this test can be used) for the duration of the COVID-19 declaration under Section 564(b)(1) of the Act, 21 U.S.C. section 360bbb-3(b)(1), unless the authorization is terminated or revoked.  Performed at [facility], [place] [address], [place], [place] [address]    SARS Coronavirus 2 Ag 04/20/2021 Negative  Negative Preliminary   Hgb A1c MFr Bld 04/21/2021 4.7 (L)  4.8 - 5.6 % Final    Comment: (NOTE) Pre diabetes:          5.7%-6.4%  Diabetes:              >6.4%  Glycemic control for   <7.0% adults with diabetes    Mean Plasma Glucose 04/21/2021 88.19  mg/dL Final   Performed at [facility], [place] [address] [place], [place] [address]   Alcohol, Ethyl (B) 04/21/2021 <10  <10 mg/dL Final   Comment: (NOTE) Lowest detectable limit for serum alcohol is 10 mg/dL.  For medical purposes only. Performed at [facility], [place] [address] [place],  [address]    Cholesterol 04/21/2021 157  0 - 200 mg/dL Final   Triglycerides 04/21/2021 76  <150 mg/dL Final   HDL 04/21/2021 52  >40 mg/dL Final   Total CHOL/HDL Ratio 04/21/2021 3.0  RATIO Final   VLDL 04/21/2021 15  0 - 40 mg/dL Final   LDL Cholesterol 04/21/2021 90  0 - 99 mg/dL Final   Comment:        Total Cholesterol/HDL:CHD Risk Coronary Heart Disease Risk Table                     Men   Women  1/2 Average Risk   3.4   3.3  Average Risk       5.0   4.4  2 X Average Risk   9.6   7.1  3 X Average Risk  23.4   11.0        Use the calculated Patient Ratio above and the CHD Risk Table to determine the patient's CHD Risk.        ATP III CLASSIFICATION (LDL):  <100     mg/dL   Optimal  100-129  mg/dL   Near or Above                    Optimal  130-159  mg/dL   Borderline  160-189  mg/dL   High  >190     mg/dL   Very  High Performed at [facility], [place] [address], [place], [place] [address]    TSH 04/21/2021 0.919  0.350 - 4.500 uIU/mL Final   Comment: Performed by a 3rd Generation assay with a functional sensitivity of <=0.01 uIU/mL. Performed at [facility], [place] [address], [place], [place] [address]    POC Amphetamine UR 04/21/2021 None Detected  NONE DETECTED (Cut Off Level 1000 ng/mL) Final   POC Secobarbital (BAR) 04/21/2021 None Detected  NONE DETECTED (Cut Off Level 300 ng/mL) Final   POC Buprenorphine (BUP) 04/21/2021 None Detected  NONE DETECTED (Cut Off Level 10  ng/mL) Final   POC Oxazepam (BZO) 04/21/2021 Positive (A)  NONE DETECTED (Cut Off Level 300 ng/mL) Final   POC Cocaine UR 04/21/2021 None Detected  NONE DETECTED (Cut Off Level 300 ng/mL) Final   POC Methamphetamine UR 04/21/2021 None Detected  NONE DETECTED (Cut Off Level 1000 ng/mL) Final   POC Morphine 04/21/2021 None Detected  NONE DETECTED (Cut Off Level 300 ng/mL) Final   POC Oxycodone UR 04/21/2021 None Detected  NONE DETECTED (Cut Off Level 100 ng/mL) Final   POC Methadone UR 04/21/2021 None Detected  NONE DETECTED (Cut Off Level 300 ng/mL) Final   POC Marijuana UR 04/21/2021 Positive (A)  NONE DETECTED (Cut Off Level 50 ng/mL) Final   WBC 04/21/2021 6.9  4.0 - 10.5 K/uL Final   RBC 04/21/2021 5.65  4.22 - 5.81 MIL/uL Final   Hemoglobin 04/21/2021 16.9  13.0 - 17.0 g/dL Final   HCT 04/21/2021 50.7  39.0 - 52.0 % Final   MCV 04/21/2021 89.7  80.0 - 100.0 fL Final   MCH 04/21/2021 29.9  26.0 - 34.0 pg Final   MCHC 04/21/2021 33.3  30.0 - 36.0 g/dL Final   RDW 04/21/2021 13.1  11.5 - 15.5 % Final   Platelets 04/21/2021 216  150 - 400 K/uL Final   nRBC 04/21/2021 0.0  0.0 - 0.2 % Final   Neutrophils Relative % 04/21/2021 61  % Final   Neutro Abs 04/21/2021 4.2  1.7 - 7.7 K/uL Final   Lymphocytes Relative 04/21/2021 29  % Final   Lymphs Abs 04/21/2021 2.0  0.7 - 4.0 K/uL Final   Monocytes Relative 04/21/2021 6  % Final   Monocytes Absolute 04/21/2021 0.4  0.1 - 1.0 K/uL Final   Eosinophils Relative 04/21/2021 3  % Final   Eosinophils Absolute 04/21/2021 0.2  0.0 - 0.5 K/uL Final   Basophils Relative 04/21/2021 1  % Final   Basophils Absolute 04/21/2021 0.1  0.0 - 0.1 K/uL Final   Immature Granulocytes 04/21/2021 0  % Final   Abs Immature Granulocytes 04/21/2021 0.03  0.00 - 0.07 K/uL Final   Performed at [facility], [place] [address] [place], [place] [address]   Sodium 04/21/2021 140  135 - 145 mmol/L Final   Potassium 04/21/2021 4.4  3.5 - 5.1 mmol/L Final   Chloride  04/21/2021 103  98 - 111 mmol/L Final   CO2 04/21/2021 30  22 - 32 mmol/L Final   Glucose, Bld 04/21/2021 93  70 - 99 mg/dL Final   Glucose reference range applies only to samples taken after fasting for at least 8 hours.   BUN 04/21/2021 13  6 - 20 mg/dL Final   Creatinine, Ser 04/21/2021 0.82  0.61 - 1.24 mg/dL Final   Calcium 04/21/2021 9.6  8.9 - 10.3 mg/dL Final   Total Protein 04/21/2021 6.9  6.5 - 8.1 g/dL Final  Albumin 04/21/2021 4.0  3.5 - 5.0 g/dL Final   AST 04/21/2021 24  15 - 41 U/L Final   ALT 04/21/2021 22  0 - 44 U/L Final   Alkaline Phosphatase 04/21/2021 55  38 - 126 U/L Final   Total Bilirubin 04/21/2021 0.6  0.3 - 1.2 mg/dL Final   GFR, Estimated 04/21/2021 >60  >60 mL/min Final   Comment: (NOTE) Calculated using the CKD-EPI Creatinine Equation (2021)    Anion gap 04/21/2021 7  5 - 15 Final   Performed at [facility], [place] [address] [place], [place] [address]    Blood Alcohol level:  Lab Results  Component Value Date   [facility] <10 04/21/2021   ETH <10 123XX123    Metabolic Disorder Labs: Lab Results  Component Value Date   HGBA1C 4.7 (L) 04/21/2021   MPG 88.19 04/21/2021   No results found for: PROLACTIN Lab Results  Component Value Date   CHOL 157 04/21/2021   TRIG 76 04/21/2021   HDL 52 04/21/2021   CHOLHDL 3.0 04/21/2021   VLDL 15 04/21/2021   LDLCALC 90 04/21/2021    Therapeutic Lab Levels: No results found for: LITHIUM No results found for: VALPROATE No components found for:  CBMZ  Physical Findings     Musculoskeletal  Strength & Muscle Tone: within normal limits Gait & Station: normal Patient leans: N/A  Psychiatric Specialty Exam  Presentation  General Appearance: Disheveled  Eye Contact:Fair  Speech:Clear and Coherent  Speech Volume:Normal  Handedness:Right   Mood and Affect  Mood:Euthymic  Affect:Non-Congruent; Depressed; Constricted   Thought Process  Thought Processes:Coherent  Descriptions of  Associations:Intact  Orientation:Full (Time, Place and Person)  Thought Content:Delusions; Obsessions; Rumination  Diagnosis of Schizophrenia or Schizoaffective disorder in past: No  Duration of Psychotic Symptoms: Less than six months   Hallucinations:Hallucinations: None  Ideas of Reference:None  Suicidal Thoughts:Suicidal Thoughts: No  Homicidal Thoughts:Homicidal Thoughts: No   Sensorium  Memory:Immediate Poor; Recent Poor; Remote Poor  Judgment:Poor  Insight:Poor   Executive Functions  Concentration:Poor  Attention Span:Poor  Recall:Poor  Fund of Knowledge:Poor  Language:Fair   Psychomotor Activity  Psychomotor Activity:Psychomotor Activity: Normal   Assets  Assets:Resilience   Sleep  Sleep:Sleep: Fair   Physical Exam  Physical Exam Constitutional:      Appearance: He is not toxic-appearing.  Pulmonary:     Effort: Pulmonary effort is normal.  Neurological:     General: No focal deficit present.     Mental Status: He is alert and oriented to person, place, and time.   Review of Systems  Respiratory:  Negative for shortness of breath.   Cardiovascular:  Negative for chest pain.  Gastrointestinal:  Negative for abdominal pain, constipation, diarrhea, nausea and vomiting.  Neurological:  Negative for headaches.   Blood pressure 107/82, pulse 80, temperature 98 F (36.7 C), temperature source Oral, resp. rate 18, SpO2 98 %. There is no height or weight on file to calculate BMI.  Treatment Plan Summary: Daily contact with patient to assess and evaluate symptoms and progress in treatment and Medication management     #Delusions and aggression, possible delusional disorder vs schizophrenia vs TBI -Increase Zyprexa to 5 mg BID QHS for psychosis -Add Depakote 500 mg q12hrs for irritability and aggression  -LFTs and plts WNL  -Check VPA level and CMP/plts on AM of 2/23 -Agitation protocol PRNs  -Haldol 5 mg PO/IM  -Ativan 2 mg  PO/IM  -Benadryl 50 mg PO/IM  Dispo: patient recommended for inpatient  psychiatric treatment. Patient is under IVC. No beds at [facility]. Patient faxed out for inpatient psychiatric treatment by CSW.    Corky Sox, MD PGY-1

## 2021-04-23 NOTE — ED Notes (Signed)
Pt refused Nicotine patch this morning and did not know where the other patch was. This nurse checked both arms and looked on the floor. Pt was resting in the bed at the time. Reminded Pt to not remove the patch and place it "just anywhere" d/t the other Pt's may get it. Will inform provider via secure chat of refusal.

## 2021-04-23 NOTE — ED Notes (Signed)
Pt was given hot meal and juice. °

## 2021-04-24 LAB — SARS CORONAVIRUS 2 (TAT 6-24 HRS): SARS Coronavirus 2: NEGATIVE

## 2021-04-24 NOTE — ED Notes (Signed)
Pt transported to [facility]. Via [employer]. Pt cooperative with transport. All belongings given to sheriff dept from locker #5 intact. Safety maintained.

## 2021-04-24 NOTE — ED Notes (Signed)
Report given to [facility], admissions coordinator at [facility].

## 2021-04-24 NOTE — ED Provider Notes (Signed)
FBC/OBS ASAP Discharge Summary  Date and Time: 04/24/2021 1:50 PM  Name: Patrick Washington  MRN:  [id number]   Discharge Diagnoses:  Final diagnoses:  MDD (major depressive disorder), single episode, severe ([place])  Suicidal ideation    Subjective:   Patrick Washington, 31 y.o., male patient presented voluntarily to the [facility] on 04/20/2021, he was admitted to the continuous assessment unit while awaiting inpatient psychiatric bed availability.   Patient seen [facility] to [place] by this provider, Dr. Dwyane Dee; and  chart reviewed on 04/24/21.  Per chart review patient has no documented psychiatric history.  During evaluation Patrick Washington was observed walking around the unit.  He is in standing position during the assessment.  He is alert/oriented and cooperative.  He is disheveled and makes fair eye contact.  He is inattentive and has poor recall.  His speech is clear, coherent, normal rate and tone.  His thought process is scattered.  He has delusional thoughts and discusses a woman named Janett Billow that he has been in love with for 15 years that he met in high school.  States he looks everywhere for her and cannot find her.  Ask this provider, "do you know her do you know where she is".  He endorses depression.  He denies suicidal ideations at this time but has endorsed them while he has been on the unit.  He denies homicidal ideations.  Objectively he does not appear to be responding to internal/external stimuli.  He denies AVH.  Stay Summary: Patient has been cooperative while on the unit however he has had times of agitation.  Patient has received as needed Haldol and Benadryl for agitation while he has been on the unit.  Patient continues to meet inpatient psychiatric admission criteria.  He has been accepted to [facility].  Total Time spent with patient: 20 minutes  Past Psychiatric History: See H&P Past Medical History: History reviewed. No pertinent past medical history.  Past Surgical History:   Procedure Laterality Date   TONSILLECTOMY     Family History: History reviewed. No pertinent family history. Family Psychiatric History: See H&P Social History:  Social History   Substance and Sexual Activity  Alcohol Use No     Social History   Substance and Sexual Activity  Drug Use No    Social History   Socioeconomic History   Marital status: Single    Spouse name: Not on file   Number of children: Not on file   Years of education: Not on file   Highest education level: Not on file  Occupational History   Not on file  Tobacco Use   Smoking status: Unknown   Smokeless tobacco: Not on file  Substance and Sexual Activity   Alcohol use: No   Drug use: No   Sexual activity: Not on file  Other Topics Concern   Not on file  Social History Narrative   Not on file   Social Determinants of Health   Financial Resource Strain: Not on file  Food Insecurity: Not on file  Transportation Needs: Not on file  Physical Activity: Not on file  Stress: Not on file  Social Connections: Not on file   SDOH:  SDOH Screenings   Alcohol Screen: Not on file  Depression (WUJ8-1): Not on file  Financial Resource Strain: Not on file  Food Insecurity: Not on file  Housing: Not on file  Physical Activity: Not on file  Social Connections: Not on file  Stress: Not  on file  Tobacco Use: Not on file  Transportation Needs: Not on file    Tobacco Cessation:  N/A, patient does not currently use tobacco products  Current Medications:  Current Facility-Administered Medications  Medication Dose Route Frequency Provider Last Rate Last Admin   acetaminophen (TYLENOL) tablet 650 mg  650 mg Oral Q6H PRN Prescilla Sours, PA-C       alum & mag hydroxide-simeth (MAALOX/MYLANTA) 200-200-20 MG/5ML suspension 30 mL  30 mL Oral Q4H PRN Margorie John W, PA-C       diphenhydrAMINE (BENADRYL) capsule 50 mg  50 mg Oral Q8H PRN Margorie John W, PA-C   50 mg at 04/21/21 2130   Or   diphenhydrAMINE  (BENADRYL) injection 50 mg  50 mg Intramuscular Q8H PRN Margorie John W, PA-C   50 mg at 04/23/21 2239   divalproex (DEPAKOTE) DR tablet 500 mg  500 mg Oral Q12H Corky Sox, MD   500 mg at 04/24/21 0902   haloperidol (HALDOL) tablet 5 mg  5 mg Oral Q8H PRN Prescilla Sours, PA-C       Or   haloperidol lactate (HALDOL) injection 5 mg  5 mg Intramuscular Q8H PRN Margorie John W, PA-C   5 mg at 04/23/21 2238   hydrOXYzine (ATARAX) tablet 25 mg  25 mg Oral TID PRN Prescilla Sours, PA-C       LORazepam (ATIVAN) tablet 2 mg  2 mg Oral Q8H PRN Prescilla Sours, PA-C   2 mg at 04/21/21 2129   Or   LORazepam (ATIVAN) injection 2 mg  2 mg Intramuscular Q8H PRN Prescilla Sours, PA-C   2 mg at 04/23/21 2238   magnesium hydroxide (MILK OF MAGNESIA) suspension 30 mL  30 mL Oral Daily PRN Margorie John W, PA-C       nicotine (NICODERM CQ - dosed in mg/24 hours) patch 21 mg  21 mg Transdermal Daily Corky Sox, MD   21 mg at 04/23/21 1255   OLANZapine (ZYPREXA) tablet 10 mg  10 mg Oral QHS Corky Sox, MD       traZODone (DESYREL) tablet 50 mg  50 mg Oral QHS PRN Prescilla Sours, PA-C   50 mg at 04/22/21 2126   No current outpatient medications on file.    PTA Medications: (Not in a hospital admission)   Musculoskeletal  Strength & Muscle Tone: within normal limits Gait & Station: normal Patient leans: N/A  Psychiatric Specialty Exam  Presentation  General Appearance: Disheveled  Eye Contact:Fair  Speech:Clear and Coherent; Normal Rate  Speech Volume:Normal  Handedness:Right   Mood and Affect  Mood:Depressed  Affect:Congruent   Thought Process  Thought Processes:Coherent  Descriptions of Associations:Intact  Orientation:Full (Time, Place and Person)  Thought Content:Logical  Diagnosis of Schizophrenia or Schizoaffective disorder in past: No  Duration of Psychotic Symptoms: Less than six months   Hallucinations:Hallucinations: None  Ideas of Reference:None  Suicidal  Thoughts:Suicidal Thoughts: No  Homicidal Thoughts:Homicidal Thoughts: No   Sensorium  Memory:Immediate Poor; Recent Poor; Remote Poor  Judgment:Poor  Insight:Poor   Executive Functions  Concentration:Poor  Attention Span:Fair  Recall:Poor  Fund of Knowledge:Fair  Language:Fair   Psychomotor Activity  Psychomotor Activity:Psychomotor Activity: Normal   Assets  Assets:Communication Skills; Desire for Improvement; Physical Health; Resilience; Social Support   Sleep  Sleep:Sleep: Fair   No data recorded  Physical Exam  Physical Exam Vitals and nursing note reviewed.  Constitutional:      General: He is not in acute distress.  Appearance: Normal appearance. He is well-developed. He is not ill-appearing.  HENT:     Head: Normocephalic and atraumatic.  Eyes:     General:        Right eye: No discharge.        Left eye: No discharge.     Conjunctiva/sclera: Conjunctivae normal.  Cardiovascular:     Rate and Rhythm: Normal rate.  Pulmonary:     Effort: Pulmonary effort is normal. No respiratory distress.  Musculoskeletal:        General: Normal range of motion.     Cervical back: Normal range of motion.  Skin:    Coloration: Skin is not jaundiced or pale.  Neurological:     Mental Status: He is alert and oriented to person, place, and time.  Psychiatric:        Attention and Perception: He is inattentive.        Mood and Affect: Mood is depressed.        Speech: Speech normal.        Behavior: Behavior is cooperative.        Thought Content: Thought content is delusional.        Cognition and Memory: Cognition normal.        Judgment: Judgment is impulsive.   Review of Systems  Constitutional: Negative.   HENT: Negative.    Eyes: Negative.   Respiratory: Negative.    Cardiovascular: Negative.   Musculoskeletal: Negative.   Skin: Negative.   Neurological: Negative.   Blood pressure 124/86, pulse 76, temperature 98 F (36.7 C), resp. rate 16,  SpO2 98 %. There is no height or weight on file to calculate BMI.  Demographic Factors:  Male, Low socioeconomic status, and Unemployed  Loss Factors: Financial problems/change in socioeconomic status  Historical Factors: Impulsivity  Risk Reduction Factors:   NA  Continued Clinical Symptoms:  Severe Anxiety and/or Agitation Depression:   Comorbid alcohol abuse/dependence Impulsivity Alcohol/Substance Abuse/Dependencies  Cognitive Features That Contribute To Risk:  None    Suicide Risk:  Severe:  Frequent, intense, and enduring suicidal ideation, specific plan, no subjective intent, but some objective markers of intent (i.e., choice of lethal method), the method is accessible, some limited preparatory behavior, evidence of impaired self-control, severe dysphoria/symptomatology, multiple risk factors present, and few if any protective factors, particularly a lack of social support.  Plan Of Care/Follow-up recommendations:  Activity:  as tolerated  Diet:  regular   Disposition: Discharge patient in transfer to [facility] for inpatient psychiatric treatment.  Dr. Selinda Flavin is the accepting physician  Revonda Humphrey, NP 04/24/2021, 1:50 PM

## 2021-04-24 NOTE — Discharge Instructions (Signed)
Transfer to [facility] for inpatient psychiatric treatment

## 2021-04-24 NOTE — ED Notes (Signed)
Pt resting in no acute distress. Easily aroused to name being called. Completed assessment without difficulty. Denies SI/HI/AVH. Pt flirtatious with male staff. Bizarre behaviors noted as pt sings out loud at random, laughs at questions asked. When RN asked if pt was hearing or seeing voices, pt laughs, then nods his head in no gesture. Informed pt that he was accepted to [facility] for further evaluation. Pt nodded his head in agreement. Informed pt to notify staff with any needs or concerns. Safety maintained.

## 2021-04-24 NOTE — ED Notes (Signed)
Pt refused to give another COVID sample. Pt states, "they just did one last night. I'm not doing that again".

## 2021-04-24 NOTE — ED Notes (Signed)
Patient is awake and eating a muffin. He is much calmer since receiving his injections. We are still awaiting the COVID Polymerase Chain Reaction test results to come back. Patient is aware he will be going to [facility] facility tomorrow pending results of COVID tests. Will continue to monitor for safety.

## 2021-04-24 NOTE — ED Notes (Signed)
Pt was given breakfast  ?

## 2021-04-24 NOTE — ED Notes (Signed)
Patient was calm at the beginning of the shift and then his behavior escalated, he started crying and hitting his head against the wall. He refused all of his evening medications, He was given injections of Benedryl, Haldol and Ativan.

## 2021-05-09 ENCOUNTER — Other Ambulatory Visit: Payer: Self-pay

## 2021-05-09 ENCOUNTER — Ambulatory Visit (HOSPITAL_COMMUNITY): Payer: No Payment, Other | Admitting: Physician Assistant

## 2022-08-16 ENCOUNTER — Emergency Department (HOSPITAL_COMMUNITY)
Admission: EM | Admit: 2022-08-16 | Discharge: 2022-08-19 | Payer: Medicaid Other | Attending: Emergency Medicine | Admitting: Emergency Medicine

## 2022-08-16 DIAGNOSIS — F6381 Intermittent explosive disorder: Secondary | ICD-10-CM | POA: Diagnosis present

## 2022-08-16 DIAGNOSIS — F23 Brief psychotic disorder: Secondary | ICD-10-CM

## 2022-08-16 DIAGNOSIS — E860 Dehydration: Secondary | ICD-10-CM

## 2022-08-16 DIAGNOSIS — E876 Hypokalemia: Secondary | ICD-10-CM

## 2022-08-16 DIAGNOSIS — F1910 Other psychoactive substance abuse, uncomplicated: Secondary | ICD-10-CM | POA: Diagnosis present

## 2022-08-16 DIAGNOSIS — Z23 Encounter for immunization: Secondary | ICD-10-CM | POA: Diagnosis not present

## 2022-08-16 DIAGNOSIS — R4182 Altered mental status, unspecified: Secondary | ICD-10-CM | POA: Diagnosis present

## 2022-08-16 DIAGNOSIS — X58XXXA Exposure to other specified factors, initial encounter: Secondary | ICD-10-CM | POA: Diagnosis not present

## 2022-08-16 DIAGNOSIS — S0081XA Abrasion of other part of head, initial encounter: Secondary | ICD-10-CM | POA: Insufficient documentation

## 2022-08-16 DIAGNOSIS — F29 Unspecified psychosis not due to a substance or known physiological condition: Secondary | ICD-10-CM | POA: Clinically undetermined

## 2022-08-16 HISTORY — DX: Other psychoactive substance abuse, uncomplicated: F19.10

## 2022-08-16 HISTORY — DX: Borderline personality disorder: F60.3

## 2022-08-16 HISTORY — DX: Other specified abnormal immunological findings in serum: R76.89

## 2022-08-16 HISTORY — DX: Unspecified psychosis not due to a substance or known physiological condition: F29

## 2022-08-16 HISTORY — DX: Other specified abnormal immunological findings in serum: R76.8

## 2022-08-16 HISTORY — DX: Intermittent explosive disorder: F63.81

## 2022-08-17 ENCOUNTER — Other Ambulatory Visit: Payer: Self-pay

## 2022-08-17 ENCOUNTER — Encounter (HOSPITAL_COMMUNITY): Payer: Self-pay | Admitting: Emergency Medicine

## 2022-08-17 ENCOUNTER — Emergency Department (HOSPITAL_COMMUNITY): Payer: Medicaid Other

## 2022-08-17 LAB — COMPREHENSIVE METABOLIC PANEL
ALT: 39 U/L (ref 0–44)
AST: 64 U/L — ABNORMAL HIGH (ref 15–41)
Albumin: 4.5 g/dL (ref 3.5–5.0)
Alkaline Phosphatase: 43 U/L (ref 38–126)
Anion gap: 20 — ABNORMAL HIGH (ref 5–15)
BUN: 30 mg/dL — ABNORMAL HIGH (ref 6–20)
CO2: 19 mmol/L — ABNORMAL LOW (ref 22–32)
Calcium: 9.8 mg/dL (ref 8.9–10.3)
Chloride: 95 mmol/L — ABNORMAL LOW (ref 98–111)
Creatinine, Ser: 1.72 mg/dL — ABNORMAL HIGH (ref 0.61–1.24)
GFR, Estimated: 54 mL/min — ABNORMAL LOW (ref >60)
Glucose, Bld: 99 mg/dL (ref 70–99)
Potassium: 3.1 mmol/L — ABNORMAL LOW (ref 3.5–5.1)
Sodium: 134 mmol/L — ABNORMAL LOW (ref 135–145)
Total Bilirubin: 2.2 mg/dL — ABNORMAL HIGH (ref 0.3–1.2)
Total Protein: 7.1 g/dL (ref 6.5–8.1)

## 2022-08-17 LAB — BASIC METABOLIC PANEL
Anion gap: 12 (ref 5–15)
BUN: 21 mg/dL — ABNORMAL HIGH (ref 6–20)
CO2: 27 mmol/L (ref 22–32)
Calcium: 9 mg/dL (ref 8.9–10.3)
Chloride: 99 mmol/L (ref 98–111)
Creatinine, Ser: 1.28 mg/dL — ABNORMAL HIGH (ref 0.61–1.24)
GFR, Estimated: >60 mL/min (ref >60)
Glucose, Bld: 86 mg/dL (ref 70–99)
Potassium: 3 mmol/L — ABNORMAL LOW (ref 3.5–5.1)
Sodium: 138 mmol/L (ref 135–145)

## 2022-08-17 LAB — CBC
HCT: 45.1 % (ref 39.0–52.0)
Hemoglobin: 15.8 g/dL (ref 13.0–17.0)
MCH: 29.3 pg (ref 26.0–34.0)
MCHC: 35 g/dL (ref 30.0–36.0)
MCV: 83.7 fL (ref 80.0–100.0)
Platelets: 266 10*3/uL (ref 150–400)
RBC: 5.39 MIL/uL (ref 4.22–5.81)
RDW: 12.5 % (ref 11.5–15.5)
WBC: 13.1 10*3/uL — ABNORMAL HIGH (ref 4.0–10.5)
nRBC: 0 % (ref 0.0–0.2)

## 2022-08-17 LAB — RAPID URINE DRUG SCREEN, HOSP PERFORMED
Amphetamines: NOT DETECTED
Barbiturates: NOT DETECTED
Benzodiazepines: NOT DETECTED
Cocaine: NOT DETECTED
Opiates: NOT DETECTED
Tetrahydrocannabinol: POSITIVE — AB

## 2022-08-17 LAB — SALICYLATE LEVEL: Salicylate Lvl: <7 mg/dL — ABNORMAL LOW (ref 7.0–30.0)

## 2022-08-17 LAB — ACETAMINOPHEN LEVEL: Acetaminophen (Tylenol), Serum: <10 ug/mL — ABNORMAL LOW (ref 10–30)

## 2022-08-17 LAB — CK
Total CK: 1168 U/L — ABNORMAL HIGH (ref 49–397)
Total CK: 837 U/L — ABNORMAL HIGH (ref 49–397)

## 2022-08-17 LAB — ETHANOL: Alcohol, Ethyl (B): <10 mg/dL (ref <10)

## 2022-08-17 MED ORDER — STERILE WATER FOR INJECTION IJ SOLN
INTRAMUSCULAR | Status: AC
  Filled 2022-08-17: qty 10

## 2022-08-17 MED ORDER — SODIUM CHLORIDE 0.9 % IV BOLUS
1000.0000 mL | Freq: Once | INTRAVENOUS | Status: AC
  Administered 2022-08-17: 1000 mL via INTRAVENOUS

## 2022-08-17 MED ORDER — ZIPRASIDONE MESYLATE 20 MG IM SOLR
10.0000 mg | Freq: Once | INTRAMUSCULAR | Status: AC | PRN
  Administered 2022-08-17: 10 mg via INTRAMUSCULAR
  Filled 2022-08-17: qty 20

## 2022-08-17 MED ORDER — DIPHENHYDRAMINE HCL 50 MG/ML IJ SOLN
50.0000 mg | Freq: Once | INTRAMUSCULAR | Status: AC | PRN
  Administered 2022-08-17: 50 mg via INTRAMUSCULAR
  Filled 2022-08-17: qty 1

## 2022-08-17 MED ORDER — OLANZAPINE 5 MG PO TBDP
5.0000 mg | ORAL_TABLET | Freq: Three times a day (TID) | ORAL | Status: DC | PRN
  Administered 2022-08-17 – 2022-08-18 (×2): 5 mg via ORAL
  Filled 2022-08-17 (×2): qty 1

## 2022-08-17 MED ORDER — ALUM & MAG HYDROXIDE-SIMETH 200-200-20 MG/5ML PO SUSP: 30.0000 mL | Freq: Four times a day (QID) | ORAL | Status: DC | PRN

## 2022-08-17 MED ORDER — ZIPRASIDONE MESYLATE 20 MG IM SOLR
10.0000 mg | INTRAMUSCULAR | Status: AC | PRN
  Administered 2022-08-17: 10 mg via INTRAMUSCULAR
  Filled 2022-08-17: qty 20

## 2022-08-17 MED ORDER — LORAZEPAM 1 MG PO TABS
1.0000 mg | ORAL_TABLET | ORAL | Status: AC | PRN
  Administered 2022-08-17: 1 mg via ORAL
  Filled 2022-08-17: qty 1

## 2022-08-17 MED ORDER — STERILE WATER FOR INJECTION IJ SOLN
INTRAMUSCULAR | Status: AC
  Administered 2022-08-17: 1.2 mL
  Filled 2022-08-17: qty 10

## 2022-08-17 MED ORDER — ZIPRASIDONE MESYLATE 20 MG IM SOLR
20.0000 mg | Freq: Once | INTRAMUSCULAR | Status: AC
  Administered 2022-08-17: 20 mg via INTRAMUSCULAR
  Filled 2022-08-17: qty 20

## 2022-08-17 MED ORDER — ACETAMINOPHEN 325 MG PO TABS: 650.0000 mg | ORAL_TABLET | ORAL | Status: DC | PRN

## 2022-08-17 MED ORDER — MIDAZOLAM HCL 2 MG/2ML IJ SOLN
2.0000 mg | Freq: Once | INTRAMUSCULAR | Status: AC
  Administered 2022-08-17: 2 mg via INTRAMUSCULAR
  Filled 2022-08-17: qty 2

## 2022-08-17 MED ORDER — POTASSIUM CHLORIDE CRYS ER 20 MEQ PO TBCR
40.0000 meq | EXTENDED_RELEASE_TABLET | ORAL | Status: AC
  Administered 2022-08-17: 40 meq via ORAL
  Filled 2022-08-17: qty 2

## 2022-08-17 NOTE — ED Notes (Signed)
Pt sitter leaving at 1100, no relief at this time

## 2022-08-17 NOTE — ED Notes (Signed)
IVC Paperwork completed and at the [employer].

## 2022-08-17 NOTE — ED Notes (Signed)
Pt is standing in room, beating head against door frame and punching wall with fist. MD notified of need for more medication.

## 2022-08-17 NOTE — ED Notes (Signed)
Pt went into BR and is heard beating head against the wall. Security went to tell patient to stop. Will administer PRN meds for agitation.

## 2022-08-17 NOTE — ED Notes (Signed)
Pt came out of room to BR. Ambulated back to nursing station, asking staff what happened to his head (pointing to the bruising on his forehead.) When staff reminded him about hitting his head on the wall earlier, he states, "Oh, you know why I did this? To prove to myself that I'm worthy of having black children." Pt ambulated back to room.

## 2022-08-17 NOTE — ED Notes (Signed)
This RN attempted to start IV fluids on the pt after giving morning medication. Pt pulled IV out. EDP made aware Pt starting to pace in hallway and try to walk out the door Sitter stopped the pt

## 2022-08-17 NOTE — ED Provider Notes (Signed)
[place] EMERGENCY DEPARTMENT AT [facility] Provider Note   CSN: [id number] Arrival date & time: 08/16/22  2311     History {Add pertinent medical, surgical, social history, OB history to HPI:1} Chief Complaint  Patient presents with   Psychiatric Evaluation    Patrick Washington. is a 32 y.o. male.  Brought to the emergency department by father becasue of severe agitation and aggression. Noted to have a bound to head and severe swelling of forehead secondary to him repeatedly slamming his head into a brick wall. Very agitated here, responses to simple questions are non-sensical. Can't stay on topic. Becomes angry and intermittently screams in response to voices only he hears. Reports that he doesn't want to be here, is a severe danger to himself and others if he leaves the hospital.       Home Medications Prior to Admission medications   Not on File      Allergies    Patient has no known allergies.    Review of Systems   Review of Systems  Physical Exam Updated Vital Signs BP (!) 147/111   Pulse (!) 117   Temp 98.2 F (36.8 C) (Oral)   Resp (!) 30   SpO2 99%  Physical Exam Vitals and nursing note reviewed.  Constitutional:      General: He is not in acute distress.    Appearance: He is well-developed.  HENT:     Head: Normocephalic. Abrasion and contusion (forehead) present.      Mouth/Throat:     Mouth: Mucous membranes are moist.  Eyes:     General: Vision grossly intact. Gaze aligned appropriately.     Extraocular Movements: Extraocular movements intact.     Conjunctiva/sclera: Conjunctivae normal.  Cardiovascular:     Rate and Rhythm: Normal rate and regular rhythm.     Pulses: Normal pulses.     Heart sounds: Normal heart sounds, S1 normal and S2 normal. No murmur heard.    No friction rub. No gallop.  Pulmonary:     Effort: Pulmonary effort is normal. No respiratory distress.     Breath sounds: Normal breath sounds.  Abdominal:      Palpations: Abdomen is soft.     Tenderness: There is no abdominal tenderness. There is no guarding or rebound.     Hernia: No hernia is present.  Musculoskeletal:        General: No swelling.     Right hand: Swelling present. No deformity. Normal range of motion.     Cervical back: Full passive range of motion without pain, normal range of motion and neck supple. No pain with movement, spinous process tenderness or muscular tenderness. Normal range of motion.     Right lower leg: No edema.     Left lower leg: No edema.  Skin:    General: Skin is warm and dry.     Capillary Refill: Capillary refill takes less than 2 seconds.     Findings: No ecchymosis, erythema, lesion or wound.  Neurological:     Mental Status: He is alert and oriented to person, place, and time.     GCS: GCS eye subscore is 4. GCS verbal subscore is 5. GCS motor subscore is 6.     Cranial Nerves: Cranial nerves 2-12 are intact.     Sensory: Sensation is intact.     Motor: Motor function is intact. No weakness or abnormal muscle tone.     Coordination: Coordination is intact.  Psychiatric:  Mood and Affect: Mood normal.        Speech: Speech normal.        Behavior: Behavior normal.     ED Results / Procedures / Treatments   Labs (all labs ordered are listed, but only abnormal results are displayed) Labs Reviewed  COMPREHENSIVE METABOLIC PANEL - Abnormal; Notable for the following components:      Result Value   Sodium 134 (*)    Potassium 3.1 (*)    Chloride 95 (*)    CO2 19 (*)    BUN 30 (*)    Creatinine, Ser 1.72 (*)    AST 64 (*)    Total Bilirubin 2.2 (*)    GFR, Estimated 54 (*)    Anion gap 20 (*)    All other components within normal limits  SALICYLATE LEVEL - Abnormal; Notable for the following components:   Salicylate Lvl <7.0 (*)    All other components within normal limits  ACETAMINOPHEN LEVEL - Abnormal; Notable for the following components:   Acetaminophen (Tylenol), Serum <10  (*)    All other components within normal limits  CBC - Abnormal; Notable for the following components:   WBC 13.1 (*)    All other components within normal limits  RAPID URINE DRUG SCREEN, HOSP PERFORMED - Abnormal; Notable for the following components:   Tetrahydrocannabinol POSITIVE (*)    All other components within normal limits  ETHANOL    EKG None  Radiology DG Hand Complete Right  Result Date: 08/17/2022 CLINICAL DATA:  Punched a wall today with hand pain, initial encounter EXAM: RIGHT HAND - COMPLETE 3+ VIEW COMPARISON:  None Available. FINDINGS: Considerable soft tissue swelling over the metacarpals is noted. No acute fracture or dislocation is noted. IMPRESSION: Soft tissue swelling without acute bony abnormality. Electronically Signed   By: Alcide Clever M.D.   On: 08/17/2022 02:04    Procedures Procedures  {Document cardiac monitor, telemetry assessment procedure when appropriate:1}  Medications Ordered in ED Medications  ziprasidone (GEODON) injection 20 mg (20 mg Intramuscular Given by Other 08/17/22 0145)  sterile water (preservative free) injection (  Given by Other 08/17/22 0145)    ED Course/ Medical Decision Making/ A&P   {   Click here for ABCD2, HEART and other calculatorsREFRESH Note before signing :1}                          Medical Decision Making Amount and/or Complexity of Data Reviewed Labs: ordered. Radiology: ordered.  Risk Prescription drug management.   Presents with severely labile behavior, intermittent agitation including severe aggression.  Patient with self-harm, banging his head on a brick wall and punching a wall prior to arrival.  CT head, x-ray negative.  Patient appears to be hallucinating, yelling and answer to voices that are not present.  History unknown.  Presentation consistent with acute psychosis.  Medical workup reveals acute kidney injury.  Patient has been very agitated.  Will check total CPK and hydrate, give  potassium.    {Document critical care time when appropriate:1} {Document review of labs and clinical decision tools ie heart score, Chads2Vasc2 etc:1}  {Document your independent review of radiology images, and any outside records:1} {Document your discussion with family members, caretakers, and with consultants:1} {Document social determinants of health affecting pt's care:1} {Document your decision making why or why not admission, treatments were needed:1} Final Clinical Impression(s) / ED Diagnoses Final diagnoses:  Acute psychosis (HCC)  Rx / DC Orders ED Discharge Orders     None

## 2022-08-17 NOTE — ED Notes (Signed)
Patrick Washington [phone] would like an update asap

## 2022-08-17 NOTE — ED Notes (Signed)
Able to redirect patient back to bed and stopping banging his head and fists against the wall. Encouraged patient to do deep breathing exercises. Pt appears to be responding to internal stimuli, seeing and talking to people who are not in room.

## 2022-08-17 NOTE — ED Notes (Signed)
TTS in process 

## 2022-08-17 NOTE — Consult Note (Signed)
Attempt was made to perform evaluation of the patient, but patient appreciably was asleep and heavily sedated. From review of MAR, patient received 10mg  Geodon @1242pm . Will notify nursing to alert this provider when patient is able to be evaluated.

## 2022-08-17 NOTE — ED Notes (Signed)
Patient continues to meet criteria for restraints; RN was attempting to correct the restraints and patient grabbed this RN and states "You are very pretty, do you know that?" "I don't think you know that" RN asked patient to not grab her arm; Patient's fingers had to be removed from around this RN's arm; [employer] and security in unit for safety-Monique,RN

## 2022-08-17 NOTE — ED Triage Notes (Addendum)
Pt in POV, dropped off by dad for psych eval. Pt states he is voluntary, recently released from jail and per father, "his psychiatric state is out of control and I can't handle him at home". Pt shouting loudly in triage, "I need a haircut, somebody come help me change these pants! I have chills all over!". Pt has large hematoma to forehead and swelling to R hand - states he banged his head against a wall and punched the wall. Pt taken to restroom stating he needs to have an urgent BM. Pacing the room in triage. Denies any pain, or SI/HI/AH/VH or substance use. Hx of BPD, depression

## 2022-08-17 NOTE — ED Provider Notes (Addendum)
Emergency Medicine Observation Re-evaluation Note  Patrick Mase. is a 32 y.o. male, seen on rounds today.  Pt initially presented to the ED for complaints of Psychiatric Evaluation Currently, the patient is punching the wall. He is appearing sedated, but states that he is punching the wall to make himself feel better. States that he doesn't want meds as he will fall asleep.  Physical Exam  BP (!) 167/118   Pulse 98   Temp 98.7 F (37.1 C) (Oral)   Resp 20   SpO2 100%  Physical Exam General: no distress Cardiac: regular rate Lungs: no resp distress Psych: agitated, flat affect  ED Course / MDM  EKG:   I have reviewed the labs performed to date as well as medications administered while in observation.  Recent changes in the last 24 hours include - pt has received 20 mg geodon followed by 10 mg. He has also received oral ativan 1 mg and benadryl 50 mg IM. No EKG done yet.  Plan  Current plan is for continued deescalation efforts. Prn geodon placed. Will need EKG, nurse made aware.  CRITICAL CARE Performed by: Shaye Elling   Total critical care time: 32 minutes  Critical care time was exclusive of separately billable procedures and treating other patients.  Critical care was necessary to treat or prevent imminent or life-threatening deterioration.  Critical care was time spent personally by me on the following activities: development of treatment plan with patient and/or surrogate as well as nursing, discussions with consultants, evaluation of patient's response to treatment, examination of patient, obtaining history from patient or surrogate, ordering and performing treatments and interventions, ordering and review of laboratory studies, ordering and review of radiographic studies, pulse oximetry and re-evaluation of patient's condition.    Derwood Kaplan, MD 08/17/22 1217  @1850 :  Nursing staff alerted me about escalating aggressive behavior by the patient.  He has  been punching the door again and striking his head onto the wall and door.  Patient reassessed.  He is able to carry on a conversation.  I discussed with him that we cannot allow for him to continue to hurt himself, and if he does not stop then we might have to restrain him.  He continued to be inappropriate.  Patient has maxed out on 40 mg of Geodon.  He has received multiple other medications.  EKG still not completed.  We will restrain him at this time.  I have requested the nurse to complete an EKG.  He is due for as needed oral Zyprexa, hope would be that he will take it.   Derwood Kaplan, MD 08/17/22 [id number]

## 2022-08-17 NOTE — ED Notes (Signed)
Pt belongings in locker 4 (side lockers)
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
## 2022-08-17 NOTE — ED Notes (Signed)
IVC paperwork in process 

## 2022-08-17 NOTE — BH Assessment (Signed)
Edythe Clarity, [facility] at [facility], states there is not an appropriate bed available. Other facilities will be contacted for placement.   Pamalee Leyden, [facility], [facility] Triage Specialist [phone]

## 2022-08-17 NOTE — ED Notes (Signed)
Patient yelling and screaming "I want it!! "I need it!" Other patients being disturb; Patient's neighbor woke up and approached room and told patient he was trying to sleep; Patient called the other patient a "N word". Staff was able to redirect other patient back to his room and EDP was notified for need of meds-Monique,RN

## 2022-08-17 NOTE — ED Provider Notes (Signed)
Blood pressure (!) 147/111, pulse (!) 117, temperature 98.2 F (36.8 C), temperature source Oral, resp. rate (!) 30, SpO2 99 %.  Assuming care from Dr. Blinda Leatherwood.  In short, Patrick Washington. is a 32 y.o. male with a chief complaint of Psychiatric Evaluation .  Refer to the original H&P for additional details.  The current plan of care is to repeat CK and BMET after IVF.  09:20 AM  Patient wandering and exhibiting some increased agitation. Activated agitation order set and will use non-violent restraints. Creatinine improved after IVF. Waiting on repeat CK.   11:13 AM  CK down-trending and creatinine improving. Patient medically clear for TTS evaluation.    Maia Plan, MD 08/17/22 1115

## 2022-08-17 NOTE — ED Notes (Signed)
Pt received to room 51 in purple zone. Ambulated to BR with standby assist. Pt seems unsteady.

## 2022-08-17 NOTE — Consult Note (Signed)
Attempt made to evaluate patient in his room. Upon arrival to psychiatry holding zone and patient room, patient observed standing at the doorway of his room belligerently posturing at security staff surrounding him, who are appreciably prompting him to return to his room and not attempt to touch staff.   Patient verbally prompted to engage in assessment and speak in his room, but patient non-verbal and postures towards this writer, then stated, "I don't want to do anything that's going to cause me trouble." Patient educated this provider was present for evaluation, and was again verbally prompted for assessment, but patient returned to being non-verbal and posturing, so engagement was terminated. Will notify nursing to alert provider when amenable to evaluation.

## 2022-08-17 NOTE — ED Notes (Signed)
Wanded by security 

## 2022-08-17 NOTE — Consult Note (Signed)
Attempt was made to perform evaluation of the patient, but patient appreciably was asleep. Will notify nursing to alert this provider when patient is able to be evaluated.

## 2022-08-17 NOTE — ED Notes (Signed)
Called staffing for need for sitter. No sitter available. Notified staffing of need for sitter ASAP.

## 2022-08-17 NOTE — ED Notes (Signed)
Ivc paperwork checked and is current

## 2022-08-17 NOTE — ED Notes (Signed)
Patient offered Tylenol for pain but declined-Monique,RN

## 2022-08-18 ENCOUNTER — Emergency Department (HOSPITAL_COMMUNITY): Payer: Medicaid Other

## 2022-08-18 ENCOUNTER — Encounter (HOSPITAL_COMMUNITY): Payer: Self-pay | Admitting: Psychiatry

## 2022-08-18 DIAGNOSIS — R4182 Altered mental status, unspecified: Secondary | ICD-10-CM

## 2022-08-18 DIAGNOSIS — F23 Brief psychotic disorder: Secondary | ICD-10-CM

## 2022-08-18 DIAGNOSIS — F29 Unspecified psychosis not due to a substance or known physiological condition: Secondary | ICD-10-CM | POA: Clinically undetermined

## 2022-08-18 MED ORDER — TETANUS-DIPHTH-ACELL PERTUSSIS 5-2.5-18.5 LF-MCG/0.5 IM SUSY
0.5000 mL | PREFILLED_SYRINGE | Freq: Once | INTRAMUSCULAR | Status: AC
  Administered 2022-08-18: 0.5 mL via INTRAMUSCULAR
  Filled 2022-08-18: qty 0.5

## 2022-08-18 MED ORDER — POTASSIUM CHLORIDE CRYS ER 20 MEQ PO TBCR
40.0000 meq | EXTENDED_RELEASE_TABLET | Freq: Once | ORAL | Status: AC
  Administered 2022-08-18: 40 meq via ORAL
  Filled 2022-08-18: qty 2

## 2022-08-18 MED ORDER — CEPHALEXIN 250 MG PO CAPS
500.0000 mg | ORAL_CAPSULE | Freq: Four times a day (QID) | ORAL | Status: DC
  Administered 2022-08-18 – 2022-08-19 (×6): 500 mg via ORAL
  Filled 2022-08-18 (×6): qty 2

## 2022-08-18 MED ORDER — ZIPRASIDONE MESYLATE 20 MG IM SOLR
20.0000 mg | Freq: Once | INTRAMUSCULAR | Status: AC | PRN
  Administered 2022-08-18: 20 mg via INTRAMUSCULAR
  Filled 2022-08-18: qty 20

## 2022-08-18 MED ORDER — LORAZEPAM 2 MG/ML IJ SOLN
2.0000 mg | Freq: Four times a day (QID) | INTRAMUSCULAR | Status: DC | PRN
  Administered 2022-08-18: 2 mg via INTRAMUSCULAR
  Filled 2022-08-18: qty 1

## 2022-08-18 MED ORDER — DIPHENHYDRAMINE HCL 50 MG/ML IJ SOLN
50.0000 mg | Freq: Four times a day (QID) | INTRAMUSCULAR | Status: DC | PRN
  Administered 2022-08-18: 50 mg via INTRAMUSCULAR
  Filled 2022-08-18: qty 1

## 2022-08-18 MED ORDER — HALOPERIDOL 5 MG PO TABS: 5.0000 mg | ORAL_TABLET | Freq: Four times a day (QID) | ORAL | Status: DC | PRN

## 2022-08-18 MED ORDER — LORAZEPAM 1 MG PO TABS
2.0000 mg | ORAL_TABLET | Freq: Four times a day (QID) | ORAL | Status: DC | PRN
  Administered 2022-08-19: 2 mg via ORAL
  Filled 2022-08-18: qty 2

## 2022-08-18 MED ORDER — RISPERIDONE 1 MG PO TABS
2.0000 mg | ORAL_TABLET | Freq: Every day | ORAL | Status: DC
  Administered 2022-08-18: 2 mg via ORAL
  Filled 2022-08-18: qty 2

## 2022-08-18 MED ORDER — STERILE WATER FOR INJECTION IJ SOLN
INTRAMUSCULAR | Status: AC
  Filled 2022-08-18: qty 10

## 2022-08-18 MED ORDER — HALOPERIDOL LACTATE 5 MG/ML IJ SOLN
5.0000 mg | Freq: Four times a day (QID) | INTRAMUSCULAR | Status: DC | PRN
  Administered 2022-08-18: 5 mg via INTRAMUSCULAR
  Filled 2022-08-18: qty 1

## 2022-08-18 MED ORDER — DIPHENHYDRAMINE HCL 25 MG PO CAPS
50.0000 mg | ORAL_CAPSULE | Freq: Four times a day (QID) | ORAL | Status: DC | PRN
  Administered 2022-08-19: 50 mg via ORAL
  Filled 2022-08-18: qty 2

## 2022-08-18 MED ORDER — DIVALPROEX SODIUM 500 MG PO DR TAB
500.0000 mg | DELAYED_RELEASE_TABLET | Freq: Two times a day (BID) | ORAL | Status: DC
  Administered 2022-08-18 – 2022-08-19 (×3): 500 mg via ORAL
  Filled 2022-08-18 (×3): qty 1
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
## 2022-08-18 NOTE — ED Notes (Signed)
Pt attempted to elope from unit, returned to room by Engineer, materials.

## 2022-08-18 NOTE — ED Notes (Signed)
When this RN arrived patient was on the phone and sister and mother was present; pt was talking to dad but didn't seem to happy; pt kept putting the phone down and walking away; Two male staff members walked in unit to go to triage and patient was seen chasing after the two staff members; [employer] and security is all in unit for support; RN has advised restraints will need to be placed back on for safety due to this patient obsession with Black females which this RN is; Patient escorted back to room and was standing in the door way while staff got situated for the shift-Monique,RN
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
## 2022-08-18 NOTE — Progress Notes (Signed)
LCSW Progress Note  [id number]   Patrick Washington  08/18/2022  2:23 PM    Inpatient Behavioral Health Placement  Pt meets inpatient criteria per Shearon Stalls There are no available beds within [facility] per [facility] Sharyne Peach, RN . Referral was sent to the following facilities;  Destination  Service Provider Address Phone Fax  [facility]  [address] [place], [place] [address] [phone]  [facility] [address] [place]  [address], [place] [address] [phone]  [facility]  [address] [place] [address] [phone]  [facility]  [address] [place] [address] [phone]  [facility]  [address] [place], [place] [address] [phone]  [facility]  [address] [place] [address] [phone]  [facility]  [address] [place] [address] [phone]  [facility]  [address], [place] [address] [phone]  [facility]  [address] [place] [address] [phone]  [facility]  [address] [place] [address] [phone]  [facility]  [address] [place] [address] [phone]  [facility]  [address] [place] [address] [phone]  [facility]  [address] [place] [address] [phone]  [facility]  [address], [place] [address] [phone]  [facility]  [address], [place] [phone]  [facility]  [address] [place]  [place] [address] [phone]  [facility]  [address], [place] [address] [phone]  [facility]  [address] [place] [address] [phone]  [facility]  [address] [place], [place] [address] [phone]  [facility]  [address], [place] [address] [phone]  [facility]  [address] [place] [address] [phone]  [facility]  [address], [place] [address] [phone]  [facility]  [address] [place] [address] [phone]  [facility]  [address] [place] [address] [phone]  [facility] [address] [place] [address] [phone]  [facility]  [address] [place], [place] [address] [phone]     Situation ongoing,  CSW will follow up.    Maryjean Ka, MSW, LCSWA 08/18/2022 2:23 PM

## 2022-08-18 NOTE — ED Notes (Signed)
Patient woke up barking like a dog at every person who walked by the door-Monique,RN
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
## 2022-08-18 NOTE — ED Notes (Signed)
Pt is repeatedly coming out of his room and trying to walk around or sit in the hallway. He has been asked multiple times to stay in his room. He states " I am looking for a sign, any sign".
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
## 2022-08-18 NOTE — ED Provider Notes (Signed)
Emergency Medicine Observation Re-evaluation Note  Patrick Washington. is a 32 y.o. male, seen on rounds today.  Pt initially presented to the ED for complaints of agitated behavior, hx 'psychosis'.  Pt without new c/o this AM.  Patient does have abrasions to bil dorsum of hands, and forehead. Pt unsure of last tetanus. Abrasions to right hand with mild surrounding erythema, possible early cellulitis.   Physical Exam  BP (!) 152/104 (BP Location: Right Arm)   Pulse 96   Temp 98.1 F (36.7 C)   Resp 16   SpO2 97%  Physical Exam General: calm, alert.  Cardiac: regular rate.  Lungs: breathing comfortably. Psych: calm, alert.  MSK: superficial abrasions to forehead, dorsum right and left hand. Around area of abrasion on right, mild erythema ?early cellulitis. No pain w passive rom digits. No focal bony tenderness.   ED Course / MDM    I have reviewed the labs performed to date as well as medications administered while in observation.  Recent changes in the last 24 hours include ED obs, reassessment.   Plan  [facility] team has reassessed and is recommending inpatient [facility] treatment.   K was mildly low. Kcl po. Tetanus IM. Will start keflex po.   Dispo per [facility] team.     Cathren Laine, MD 08/18/22 1145
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
## 2022-08-19 ENCOUNTER — Encounter (HOSPITAL_COMMUNITY): Payer: Self-pay

## 2022-08-19 DIAGNOSIS — F1910 Other psychoactive substance abuse, uncomplicated: Secondary | ICD-10-CM | POA: Diagnosis present

## 2022-08-19 DIAGNOSIS — F6381 Intermittent explosive disorder: Secondary | ICD-10-CM | POA: Diagnosis present

## 2022-08-19 LAB — HEPATITIS PANEL, ACUTE
HCV Ab: NONREACTIVE
Hep A IgM: UNDETERMINED — AB
Hep B C IgM: NONREACTIVE
Hepatitis B Surface Ag: NONREACTIVE

## 2022-08-19 LAB — RAPID HIV SCREEN (HIV 1/2 AB+AG)
HIV 1/2 Antibodies: NONREACTIVE
HIV-1 P24 Antigen - HIV24: NONREACTIVE

## 2022-08-19 MED ORDER — HALOPERIDOL LACTATE 5 MG/ML IJ SOLN: 10.0000 mg | Freq: Four times a day (QID) | INTRAMUSCULAR | Status: DC | PRN

## 2022-08-19 MED ORDER — RISPERIDONE 1 MG PO TBDP
1.0000 mg | ORAL_TABLET | Freq: Two times a day (BID) | ORAL | Status: DC
  Administered 2022-08-19: 1 mg via ORAL
  Filled 2022-08-19: qty 1

## 2022-08-19 MED ORDER — HALOPERIDOL 5 MG PO TABS
10.0000 mg | ORAL_TABLET | Freq: Four times a day (QID) | ORAL | Status: DC | PRN
  Administered 2022-08-19: 10 mg via ORAL
  Filled 2022-08-19: qty 2

## 2022-08-19 MED ORDER — BENZTROPINE MESYLATE 1 MG PO TABS: 1.0000 mg | ORAL_TABLET | Freq: Two times a day (BID) | ORAL | Status: DC | PRN

## 2022-08-19 MED ORDER — BENZTROPINE MESYLATE 1 MG/ML IJ SOLN: 1.0000 mg | Freq: Two times a day (BID) | INTRAMUSCULAR | Status: DC | PRN

## 2022-08-19 NOTE — ED Notes (Signed)
Pt being fed by sitter at this time. Went in to obtain hourly VS from [employer] and pt started saying, "You're pretty, you're really pretty, I don't know you but I need to know you". This RN did not respond to pt or acknowledge the things he was saying. Security at bedside while sitter feeds pt for safety.

## 2022-08-19 NOTE — ED Notes (Signed)
Pt discharged with [employer] 

## 2022-08-19 NOTE — ED Notes (Signed)
Called pt's mother to update her and pt's mother was verbally aggressive w/ this RN. Pt's mother said she spoke w/ Carollee Herter CSW about paperwork and that she was helping pt's mother w/ guardianship stuff. Tried to explain to pt's mother that pt is under behavioral health. Notified Ricquita CSW about situation.

## 2022-08-19 NOTE — ED Notes (Signed)
All belongings obtained and given to [employer]. D/C papers given to [employer]. Will go over D/C papers once the officer that is going to transport pt gets here.

## 2022-08-19 NOTE — ED Notes (Signed)
Pt remaining in restraints until [employer] here to get pt for pt and staff safety.

## 2022-08-19 NOTE — ED Notes (Signed)
Pt started yelling, "I want those dreads! I want them I want them!" Instructed pt that he needs to stop yelling. Pt kept yelling. [employer] to bedside at this time.
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
## 2022-08-19 NOTE — ED Notes (Signed)
Offered to reposition pt's bed for him, but pt reports he is comfortable the way the bed is. While giving pt his medication, pt commenting on this RN's eye color and hair color and stating that red hair and blue eyes is a rare combination and "that is how rare you are". This RN exited the room.
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
## 2022-08-19 NOTE — ED Notes (Signed)
Pt yelling from room at staff, "I think you are fucking beautiful. I do! I do!, Yes I do!"

## 2022-08-19 NOTE — Progress Notes (Signed)
[facility] Referral  Per Eligha Bridegroom, NP, pt continues to meet criteria for inpatient treatment. CSW completed Regional Referral Form and faxed complete referral to [facility] for review for priority waitlist for inpatient treatment. CSW will continue to assist and follow with placement.  Cathie Beams, [place]  08/19/2022 9:56 AM

## 2022-08-19 NOTE — ED Notes (Signed)
Pt now yelling from room asking this RN what her hair color is. RN continuing to monitor pt.

## 2022-08-19 NOTE — Progress Notes (Signed)
3:07 PM - CSW completed verbal demographics with Annia Friendly, with intake department, at [facility]. Pt is now under review at [facility] for inpatient treatment. Annia Friendly reports [facility] intake staff will reach out to CSW when decision is made regarding pt's waitlist status. CSW will continue to assist and follow with placement.  Cathie Beams, LCSW  08/19/2022 3:12 PM

## 2022-08-19 NOTE — ED Notes (Signed)
Pt repositioned in the bed and restraints adjusted w/ security and [employer] at bedside. Security stood by while [employer] pt to maintain staff safety. The reason pt is in restraints was explained to pt. Pt neither verbalized understanding nor verbalized not understanding. Pt hypersexual commenting on male staff's eyes, hair, beauty, etc. Pt also talking about porn.

## 2022-08-19 NOTE — Discharge Instructions (Signed)
DAY CENTERS [facility] Center (IRC) Monday - Friday 8am - 3pm          Sat & Sun 8am - 2pm [address] [place], [place] [address]   [phone]     www.i[email or web address] IRC offers among other critical resources: showers, laundry, barbershop, phone bank, mailroom, computer lab, medical clinic, gardens and a bike maintenance area.   AREA SHELTERS  [facility]  (Men & women) [address] [place] [phone]  [facility] (Men/women/families) [address] [place] [phone]   [facility] (Families with children) [address]  [place] [phone]   [facility] (Domestic Violence Shelter) [address]  [place] [phone]   [facility] (Children ages 7-17) [address]  [place] [phone]   [facility]   (Women & children) [address] [place] [phone]   [facility] (Women/substance abuse) [address]  [place] [phone]   [facility] (Men) [address]  [place] [phone]   [facility] (Men) [address]  [place] [phone]  [facility] (Women) [address]  [place] [phone]   [facility] (Single women & women with children) [address]  [place] [phone]  [facility] (Men/women/families) [address]  [place] [phone]    [facility]   (Domestic Violence shelter) [address]  [place] [phone]   [facility] (Men & women) [address]  [place] [phone]  [facility] (Men) [address]  [place] [phone]   [place] [facility] (Men) [address]  [place] [phone]   [facility] (Single women & families) [address]  [place] [phone]  [facility] (Men/women & families) [address]   [place] [phone]    If you are at risk of losing your housing (throughout [place]) call the Housing Hotline at (336)691-9521. You may also contact 2-1-1, a FREE service of the United Way that provides information about many resources including housing. Dial 211, or visit online at www.nc211.org. [place]:  [facility], contact Steve Adams [phone] (men only)                          [facility] in [place]:  90 day homeless program for women and men;                             contact Rev. Chambers [phone]  [place]:  [facility]:  men/women/children [phone]  [place]:  Shelter in [place], Pastor Kivett [phone]                       [facility] in [place], Santiago Lopez [phone]   [place]:  [facility] for Abused Women, [phone]; (women and children)  [place]:  [facility], men/women/children; [phone]                           [facility] in [place], [phone]; substance abuse halfway house for men              [facility]; 4 bedroom house in [place] for homeless women, contact Elaine Owens [phone]               [facility]   in [place], [phone] (women and children)               [facility], for abused women and children, 24 hour crisis line, [phone], Jamison Oka  [facility], halfway house for women, [place], [phone]  [facility]:  [facility], [phone]; open Mon-Thurs from MWU13 - March 15 when temp is below 32 degrees                              [facility]; Alwyn Pea [place], [phone]; cell [phone]; open 24/7  [facility]:  Outreach for [place] - Rev Ladona Ridgel - [phone]  [place]:  transitional housing for women and children; Arminda Resides [phone] Resource Center  Hours Monday - Friday: Services: 8:00AM -  3:00PM Offices: 8:00AM - 5:00PM  Physical Address [address] [place], [place] [address]   Please use this address for [facility] Mailing Address [address] [address] [place], [place] [address]  The [facility] helps people reconnect This is a safe place to rest, take care of basic needs and access the services and community that make all the difference. Our guests come to the [facility] to take a class, do laundry, meet with a case manager or to get their mail. Sometimes they just need to sit in our dayroom and enjoy a conversation.  Here you will find everything from shower facilities to a computer lab, a mail room, classrooms and meeting spaces.  The IRC helps people reconnect with their own lives and with the community at large.  A caring community setting One of the most exciting aspects of the IRC is that so many individuals and organizations in the community are a part of the everyday experience. Whether it's a hair stylist or law firm offering services right in-house, our partners make the [facility] a truly interactive resource center where services are brought to our guests. The IRC brings together a comprehensive community of talented people who not only want to help solve problems, but also to be a part of our guests' lives.  Integrated Care We take a person-centered approach to assistance that includes: Case management [address] [employer] Medical clinic Mental health nurse Referrals  Fundamental Services We start with necessities: Showers and Armed forces operational officer addresses and mailboxes Replacement IDs Onsite barbershop Storage lockers [place] [employer] winter warming center  Self-Sufficiency We connect our guests with: Skilled trade classes Job skills classes Resume and jobs application assistance Interview training GED Psychologist, educational we are unable to provide direct housing, financial, or social services, [employer]  cares deeply for this community. We would be happy to connect you with other organizations in our community that may be able to provide assistance. We invite you to connect with [place] for spiritual support and prayer during difficult times. Our hearts are with you, and we hope these resources can help point you in a positive direction. Please reach out if we can provide additional support.  [facility] Address: [address] [place], [place] [address] Phone: [phone] Website: View Here Hours: 9am-4 pm. (closed 12:30 for lunch)  Description: You can come to [place] for help with your resume and cover letter. We help instruct individuals on how to apply for jobs online and give tips on how to prepare for interviews. We host hiring events every week and our helpful staff can work with you on a  one-on-one basis.   [employer], [employer] Address: [address] [place], [place] [address] Phone: [phone] Website: View Here Hours: M,T,TH: 9:00am-4:00pm W: 9:00am-6:00pm F: 9:00am-2:00pm  Description: [employer] is Statistician. Job seekers can Financial controller for jobs, create resumes, and find education and training. Employers can find candidates, post jobs, and Sales executive.  [facility] Address: [address] [place], [place], [place] [address] Phone: [phone] Website: View Here Hours: M-F: 8:30am-5pm  Description: [employer] is a 89 (c)(3) organization that provides free job readiness training, active mentoring, and supportive services to help individuals find and keep jobs and build stable lives.  [employer] Address: [address], [place], [place] [address] Phone: [phone] Website: View Here Hours: M-F: 8:30am-5pm  Description: Employment program for formerly incarcerated Field seismologist Location: Address: [address] [phone]  Phone: [phone] Website: View  Here Hours: Unknown  [place] Location: Address: [address], Suite [place] [address] Phone: [phone] Website: View Here Hours: Unknown  Description: Support of employment for individuals with a disability[place] [facility] [address], [place]  [address] [phone]  Psychiatry walk-in available Monday, Wednesday, Thursday, and Friday.   Hours:  8:000 AM to 11:00 AM.  We encourage patient to arrive at 7:30 AM because it is first come first serve and after a certain quote has been reach you will have to return another day the service is offered.  Therapy assessment walk-in available on Monday, Tuesday, Wednesday Hours 8:00 AM to 11:00 AM.   We encourage patient to arrive at 7:30 AM because it is first come first serve and after a certain quote has been reach you will have to return another day the service is offered.  [facility] on Tuesday morning and Thursday afternoon.   [facility] [address], [place]  [address] [phone]  Psychiatry walk-in available Monday, Wednesday, Thursday, and Friday.   Hours:  8:000 AM to 11:00 AM.  We encourage patient to arrive at 7:30 AM because it is first come first serve and after a certain quote has been reach you will have to return another day the service is offered.  Therapy assessment walk-in available on Monday, Tuesday, Wednesday Hours 8:00 AM to 11:00 AM.   We encourage patient to arrive at 7:30 AM because it is first come first serve and after a certain quote has been reach you will have to return another day the service is offered.  [facility] on Tuesday morning and Thursday afternoon.
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
## 2022-08-19 NOTE — Consult Note (Signed)
Per chart review, patient has a history significant for borderline personality disorder, intermittent explosive disorder in an adult and bipolar disorder. In addition, patient has an outstanding warrant for his arrest.  After consultation with emergency room provider, psychiatry is in agreement with releasing patient into police custody.

## 2022-09-08 ENCOUNTER — Encounter (HOSPITAL_COMMUNITY): Payer: Self-pay

## 2022-09-08 ENCOUNTER — Emergency Department (HOSPITAL_COMMUNITY)
Admission: EM | Admit: 2022-09-08 | Discharge: 2022-09-08 | Disposition: A | Discharge status: Home / Self Care | Source: Home / Self Care | Attending: Emergency Medicine | Admitting: Emergency Medicine

## 2022-09-08 ENCOUNTER — Emergency Department (HOSPITAL_COMMUNITY)

## 2022-09-08 ENCOUNTER — Other Ambulatory Visit: Payer: Self-pay

## 2022-09-08 DIAGNOSIS — R45851 Suicidal ideations: Secondary | ICD-10-CM

## 2022-09-08 DIAGNOSIS — W2201XA Walked into wall, initial encounter: Secondary | ICD-10-CM | POA: Diagnosis not present

## 2022-09-08 DIAGNOSIS — S0990XA Unspecified injury of head, initial encounter: Secondary | ICD-10-CM | POA: Diagnosis not present

## 2022-09-08 DIAGNOSIS — Z7289 Other problems related to lifestyle: Secondary | ICD-10-CM

## 2022-09-08 LAB — COMPREHENSIVE METABOLIC PANEL
ALT: 68 U/L — ABNORMAL HIGH (ref 0–44)
AST: 49 U/L — ABNORMAL HIGH (ref 15–41)
Albumin: 4.3 g/dL (ref 3.5–5.0)
Alkaline Phosphatase: 48 U/L (ref 38–126)
Anion gap: 10 (ref 5–15)
BUN: 19 mg/dL (ref 6–20)
CO2: 22 mmol/L (ref 22–32)
Calcium: 9.5 mg/dL (ref 8.9–10.3)
Chloride: 110 mmol/L (ref 98–111)
Creatinine, Ser: 0.99 mg/dL (ref 0.61–1.24)
GFR, Estimated: >60 mL/min (ref >60)
Glucose, Bld: 141 mg/dL — ABNORMAL HIGH (ref 70–99)
Potassium: 3.7 mmol/L (ref 3.5–5.1)
Sodium: 142 mmol/L (ref 135–145)
Total Bilirubin: 1.7 mg/dL — ABNORMAL HIGH (ref 0.3–1.2)
Total Protein: 7.3 g/dL (ref 6.5–8.1)

## 2022-09-08 LAB — ACETAMINOPHEN LEVEL: Acetaminophen (Tylenol), Serum: <10 ug/mL — ABNORMAL LOW (ref 10–30)

## 2022-09-08 LAB — CBC
HCT: 46.4 % (ref 39.0–52.0)
Hemoglobin: 15.6 g/dL (ref 13.0–17.0)
MCH: 29.3 pg (ref 26.0–34.0)
MCHC: 33.6 g/dL (ref 30.0–36.0)
MCV: 87.2 fL (ref 80.0–100.0)
Platelets: 216 10*3/uL (ref 150–400)
RBC: 5.32 MIL/uL (ref 4.22–5.81)
RDW: 13.1 % (ref 11.5–15.5)
WBC: 14 10*3/uL — ABNORMAL HIGH (ref 4.0–10.5)
nRBC: 0 % (ref 0.0–0.2)

## 2022-09-08 LAB — ETHANOL: Alcohol, Ethyl (B): <10 mg/dL (ref <10)

## 2022-09-08 LAB — SALICYLATE LEVEL: Salicylate Lvl: <7 mg/dL — ABNORMAL LOW (ref 7.0–30.0)

## 2022-09-08 MED ORDER — ZIPRASIDONE MESYLATE 20 MG IM SOLR
20.0000 mg | Freq: Once | INTRAMUSCULAR | Status: AC
  Administered 2022-09-08: 20 mg via INTRAMUSCULAR
  Filled 2022-09-08: qty 20

## 2022-09-08 MED ORDER — STERILE WATER FOR INJECTION IJ SOLN
INTRAMUSCULAR | Status: AC
  Filled 2022-09-08: qty 10
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
## 2022-09-08 NOTE — ED Provider Notes (Addendum)
[place] EMERGENCY DEPARTMENT AT [facility] Provider Note   CSN: [id number] Arrival date & time: 09/08/22  2002     History  Chief Complaint  Patient presents with   Suicidal    Patrick Washington. is a 32 y.o. male.  32 year old male with prior medical history as detailed below presents for evaluation.  Patient arrives from jail.  Patient with reported suicidal ideation.  Per law enforcement, patient has been punching the walls of his cell and slamming his forehead against the walls of the cell.  Patient is covered in his own feces.  Per Patent examiner and EMS, patient has been eating his feces.  Patient with known history of aggressive and violent behavior.  The patient is currently in handcuffs.  On arrival to the ED the patient is cooperative.  He is in Patent examiner custody.  Chart review suggests that patient was released from IVC hold here on June 17 and transported to jail after assaulting security at the hospital. 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  The history is provided by the patient, the EMS personnel and medical records.       Home Medications Prior to Admission medications   Not on File      Allergies    Patient has no known allergies.    Review of Systems   Review of Systems  All other systems reviewed and are negative.   Physical Exam Updated Vital Signs There were no vitals taken for this visit. Physical Exam Vitals and nursing note reviewed.  Constitutional:      General: He is not in acute distress.    Appearance: Normal appearance. He is well-developed.  HENT:     Head: Normocephalic and atraumatic.  Eyes:     Conjunctiva/sclera: Conjunctivae normal.     Pupils: Pupils are equal, round, and reactive to light.  Cardiovascular:     Rate and Rhythm: Normal rate and regular rhythm.     Heart sounds: Normal heart sounds.  Pulmonary:      Effort: Pulmonary effort is normal. No respiratory distress.     Breath sounds: Normal breath sounds.  Abdominal:     General: There is no distension.     Palpations: Abdomen is soft.     Tenderness: There is no abdominal tenderness.  Musculoskeletal:        General: No deformity. Normal range of motion.     Cervical back: Normal range of motion and neck supple.  Skin:    General: Skin is warm and dry.  Neurological:     General: No focal deficit present.     Mental Status: He is alert and oriented to person, place, and time.     ED Results / Procedures / Treatments   Labs (all labs ordered are listed, but only abnormal results are displayed) Labs Reviewed  COMPREHENSIVE METABOLIC PANEL - Abnormal; Notable for the following components:      Result Value   Glucose, Bld 141 (*)    AST 49 (*)    ALT 68 (*)    Total Bilirubin 1.7 (*)    All other components within normal limits  SALICYLATE LEVEL - Abnormal; Notable for the following components:   Salicylate Lvl <7.0 (*)    All other components within normal limits  ACETAMINOPHEN LEVEL - Abnormal; Notable for the following components:   Acetaminophen (Tylenol), Serum <10 (*)    All other components within  normal limits  CBC - Abnormal; Notable for the following components:   WBC 14.0 (*)    All other components within normal limits  ETHANOL  RAPID URINE DRUG SCREEN, HOSP PERFORMED    EKG None  Radiology No results found.  Procedures Procedures    Medications Ordered in ED Medications - No data to display  ED Course/ Medical Decision Making/ A&P                             Medical Decision Making Amount and/or Complexity of Data Reviewed Labs: ordered. Radiology: ordered.  Risk Prescription drug management.    Medical Screen Complete  This patient presented to the ED with complaint of self-injurious behavior, eating feces, SI.  This complaint involves an extensive number of treatment options. The  initial differential diagnosis includes, but is not limited to, mental health crisis  This presentation is: Acute, Chronic, Self-Limited, Previously Undiagnosed, Uncertain Prognosis, Complicated, Systemic Symptoms, and Threat to Life/Bodily Function  Patient presents in law enforcement custody.  Patient is currently in jail on suicide watch.  Per law enforcement and EMS patient with self-injurious behavior where he has been slamming his forehead and hands into the walls of his cell.  Patient is also ingesting his own feces - he arrives covered in his own feces.   Patient without evidence of significant traumatic injury on imaging.  Screening labs obtained are without significant abnormality.   Patient is medically cleared for psychiatric evaluation.  2300 Advised by Venda Rodes and Roselyn Bering, NP that patient should bed discharged back to jail. Law enforcement psych team to manage.    Additional history obtained:  Additional history obtained from EMS External records from outside sources obtained and reviewed including prior ED visits and prior Inpatient records.    Lab Tests:  I ordered and personally interpreted labs.  Imaging Studies ordered:  I ordered imaging studies including CT head, imaging of bilateral hands I independently visualized and interpreted obtained imaging which showed NAD I agree with the radiologist interpretation.   Problem List / ED Course:  SI, Self injurious behavior, Coprophagia   Reevaluation:  After the interventions noted above, I reevaluated the patient and found that they have: stayed the same   Disposition:  After consideration of the diagnostic results and the patients response to treatment, I feel that the patent would benefit from psychiatric evaluation.          Final Clinical Impression(s) / ED Diagnoses Final diagnoses:  Suicidal ideation  Self-injurious behavior    Rx / DC Orders ED Discharge Orders     None          Wynetta Fines, MD 09/08/22 2206    Wynetta Fines, MD 09/08/22 [phone]

## 2022-10-28 ENCOUNTER — Ambulatory Visit (HOSPITAL_COMMUNITY)
Admission: EM | Admit: 2022-10-28 | Discharge: 2022-10-28 | Disposition: A | Discharge status: Home / Self Care | Payer: MEDICAID | Attending: Psychiatry | Admitting: Psychiatry

## 2022-10-28 DIAGNOSIS — Z760 Encounter for issue of repeat prescription: Secondary | ICD-10-CM | POA: Insufficient documentation

## 2022-10-28 DIAGNOSIS — F1910 Other psychoactive substance abuse, uncomplicated: Secondary | ICD-10-CM | POA: Insufficient documentation

## 2022-10-28 NOTE — ED Provider Notes (Signed)
Behavioral Health Urgent Care Medical Screening Exam  Patient Name: Patrick Washington. MRN: [id number] Date of Evaluation: 10/29/22 Chief Complaint:   Diagnosis:  Final diagnoses:  Medication refill  Substance abuse (HCC)    History of Present illness: Patrick Washington. is a 32 y.o. male. With a history of acute psychosis, MDD, behavioral concern, substance abuse, aggressive behavior.  Presented to [facility] voluntarily.  Per the patient he needs to get his medications refill.  According to the patient he got out of jail a couple days ago patient stated he needs his Haldol needs medicine for anxiety.  When asked if he has a provider who was writing his medication patient stated yes he gets them from day [facility] discussed with patient that he needs to reach out to day [facility] Leriche to restart his medications.  According to the patient his sister told him to come here and he was given his medicine.  Face-to-face observation of patient, patient is alert and oriented x 4, during patient interaction security was present for protection because of patient's violent history.  Patient denies SI, HI, AVH or paranoia at this time.  Patient reports he use delta 8 and other substances which she did not go and get about.  Patient denies alcohol use.  Patient denies access to guns according to patient he is staying with his sister.  Resources were provided to patient to either reach out to his day [facility] team or he can follow up with walk-in psychiatry for medication refill. Patient is advised to call 911 or reach out ED should he experience any suicidal ideation or homicidal ideation or hallucination.  Recommend discharge for patient to follow-up outpatient services including day [facility] or walking psychiatry  Flowsheet Row ED from 10/28/2022 in [facility] ED from 09/08/2022 in [facility] Emergency Department at [facility] ED from 08/16/2022 in [facility] Emergency Department at [facility] Psychiatric  C-SSRS RISK CATEGORY No Risk High Risk No Risk       Psychiatric Specialty Exam  Presentation  General Appearance:Casual  Eye Contact:Fair  Speech:Clear and Coherent  Speech Volume:Normal  Handedness:Right   Mood and Affect  Mood: Anxious  Affect: Appropriate   Thought Process  Thought Processes: Coherent  Descriptions of Associations:Circumstantial  Orientation:Full (Time, Place and Person)  Thought Content:WDL  Diagnosis of Schizophrenia or Schizoaffective disorder in past: No   Hallucinations:None  Ideas of Reference:None  Suicidal Thoughts:No  Homicidal Thoughts:No   Sensorium  Memory: Immediate Fair  Judgment: Fair  Insight: Fair   Art therapist  Concentration: Fair  Attention Span: Fair  Recall: [employer] of Knowledge: Fair  Language: Fair   Psychomotor Activity  Psychomotor Activity: Normal   Assets  Assets: Desire for Improvement   Sleep  Sleep: Fair  Number of hours:  6   Physical Exam: Physical Exam HENT:     Head: Normocephalic.     Nose: Nose normal.  Eyes:     Pupils: Pupils are equal, round, and reactive to light.  Cardiovascular:     Rate and Rhythm: Normal rate.  Pulmonary:     Effort: Pulmonary effort is normal.  Musculoskeletal:        General: Normal range of motion.     Cervical back: Normal range of motion.  Neurological:     General: No focal deficit present.     Mental Status: He is alert.  Psychiatric:        Mood and Affect:  Mood normal.        Behavior: Behavior normal.        Thought Content: Thought content normal.        Judgment: Judgment normal.    Review of Systems  Constitutional: Negative.   HENT: Negative.    Eyes: Negative.   Respiratory: Negative.    Cardiovascular: Negative.   Gastrointestinal: Negative.   Genitourinary: Negative.   Musculoskeletal: Negative.   Skin: Negative.   Neurological: Negative.   Psychiatric/Behavioral:   Positive for substance abuse. The patient is nervous/anxious.    Blood pressure (!) 148/92, pulse 100, temperature 98.3 F (36.8 C), temperature source Oral, resp. rate 18, SpO2 99%. There is no height or weight on file to calculate BMI.  Musculoskeletal: Strength & Muscle Tone: within normal limits Gait & Station: normal Patient leans: N/A   [facility] MSE Discharge Disposition for Follow up and Recommendations: Based on my evaluation the patient does not appear to have an emergency medical condition and can be discharged with resources and follow up care in outpatient services for Medication Management   Sindy Guadeloupe, NP 10/29/2022, 5:11 AM

## 2022-10-28 NOTE — ED Notes (Signed)
Pt escorted out by security

## 2022-10-28 NOTE — Discharge Instructions (Signed)
F/u with [facility] for medication refill or walk-in psychiatry

## 2022-10-29 NOTE — Progress Notes (Signed)
   10/28/22 2310  [facility] Triage Screening (Walk-ins at [facility] only)  How Did You Hear About [place]? Family/Friend  What Is the Reason for Your Visit/Call Today? Patrick Washington is a 32 year old male presenting as a voluntary walk-in to [facility] requesting a haldol shot. Patient denied SI, HI, psychosis and alcohol/drug usage. Patient requesting Haldol shot and stating that he needs his phone. Patient reported mother and sister told him to come here. Patient initially states that he is being seen for medication management by [facility] in [place] and then towards end patient states he does not have a provider for medication management.  How Long Has This Been Causing You Problems? <Week  Have You Recently Had Any Thoughts About Hurting Yourself? No  Are You Planning to Commit Suicide/Harm Yourself At This time? No  Have you Recently Had Thoughts About Hurting Someone [name]? No  Are You Planning To Harm Someone At This Time? No  Are you currently experiencing any auditory, visual or other hallucinations? No  Have You Used Any Alcohol or Drugs in the Past 24 Hours? Yes  How long ago did you use Drugs or Alcohol? delta 8  What Did You Use and How Much? unknown  Do you have any current medical co-morbidities that require immediate attention? No  Clinician description of patient physical appearance/behavior: disheveled / cooperative  What Do You Feel Would Help You the Most Today? Medication(s)  If access to [facility] was not available, would you have sought care in the Emergency Department? No  Determination of Need Routine (7 days)  Options For Referral Medication Management;Outpatient Therapy    Flowsheet Row ED from 10/28/2022 in [facility] ED from 08/16/2022 in [facility] Emergency Department at [facility]  C-SSRS RISK CATEGORY No Risk No Risk

## 2022-11-05 ENCOUNTER — Ambulatory Visit (HOSPITAL_COMMUNITY)
Admission: EM | Admit: 2022-11-05 | Discharge: 2022-11-05 | Disposition: A | Discharge status: Home / Self Care | Payer: MEDICAID | Attending: Psychiatry | Admitting: Psychiatry

## 2022-11-05 ENCOUNTER — Encounter (HOSPITAL_COMMUNITY): Payer: Self-pay | Admitting: Registered Nurse

## 2022-11-05 DIAGNOSIS — Z653 Problems related to other legal circumstances: Secondary | ICD-10-CM | POA: Insufficient documentation

## 2022-11-05 DIAGNOSIS — Z59811 Housing instability, housed, with risk of homelessness: Secondary | ICD-10-CM | POA: Diagnosis present

## 2022-11-05 DIAGNOSIS — F6381 Intermittent explosive disorder: Secondary | ICD-10-CM | POA: Insufficient documentation

## 2022-11-05 DIAGNOSIS — F4325 Adjustment disorder with mixed disturbance of emotions and conduct: Secondary | ICD-10-CM | POA: Diagnosis not present

## 2022-11-05 DIAGNOSIS — F603 Borderline personality disorder: Secondary | ICD-10-CM | POA: Diagnosis present

## 2022-11-05 MED ORDER — LORAZEPAM 1 MG PO TABS
2.0000 mg | ORAL_TABLET | Freq: Once | ORAL | Status: AC
  Administered 2022-11-05: 2 mg via ORAL
  Filled 2022-11-05: qty 2

## 2022-11-05 NOTE — ED Notes (Signed)
Patient discharged by Richelle Ito, RN with written and verbal instructions. Resources provided.
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
## 2022-11-05 NOTE — ED Notes (Addendum)
1445- Pt had previously been banging walls shortly after arrival to unit. BP elevated and Ativan given per NP order after manual BP recheck completed.  Pt was also given juice and writer informed pt that additional BP recheck was needed after medication administration and that staff would recheck- pt appeared to calm and was appropriate with staff after this event.  Pt logical and oriented and appeared in no acute distress.   1535- Pt could be heard banging walls from observation unit again. Writer heard yelling from unit and approached and found pt banging head onto the wall with such force that patient has visibly broken the skin with large abrasion to forehead that is bleeding and skin is broken. Pt when approached states '' he was bored. '' Writer offered verbal reassurance. Security to room as well. NP notified of above and writer placed gauze over wound to head.   1538- NP in to assess with counselor and security presence. Situation ongoing. Will continue to monitor.
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
## 2022-11-05 NOTE — Discharge Instructions (Signed)
[facility]  Hours Monday - Friday: Services: 8:00AM - 3:00PM Offices: 8:00AM - 5:00PM  Physical Address [address] [place], [place] [address]   Please use this address for [facility] Mailing Address [address] [address] [place], [place] [address]  The [facility] helps people reconnect This is a safe place to rest, take care of basic needs and access the services and community that make all the difference. Our guests come to the [facility] to take a class, do laundry, meet with a case manager or to get their mail. Sometimes they just need to sit in our dayroom and enjoy a conversation.  Here you will find everything from shower facilities to a computer lab, a mail room, classrooms and meeting spaces.  The IRC helps people reconnect with their own lives and with the community at large.  A caring community setting One of the most exciting aspects of the IRC is that so many individuals and organizations in the community are a part of the everyday experience. Whether it's a hair stylist or law firm offering services right in-house, our partners make the [facility] a truly interactive resource center where services are brought to our guests. The IRC brings together a comprehensive community of talented people who not only want to help solve problems, but also to be a part of our guests' lives.  Integrated Care We take a person-centered approach to assistance that includes: Case management [address] [employer] [facility] Mental health nurse Referrals  Fundamental Services We start with necessities: Showers and Armed forces operational officer addresses and mailboxes Replacement IDs Onsite barbershop Storage lockers [place] winter warming center  Self-Sufficiency We connect our guests with: Skilled trade classes Job skills classes Resume and jobs application assistance Interview training GED Psychologist, sport and exercise vouchers Financial literacy  [employer] Address: [address] [place], [place], [place] [address] Phone: [phone]  Supported Employment The supported employment program is a person-centered, individualized, evidence-based support service that helps members choose, acquire, and maintain competitive employment in our community. This service supports the varying needs of individuals and promotes community inclusion and employment success. Members enrolled in the supported employment program can expect the following:  Development of an individual career plan Community based job placement Job shadowing Job development On-site job Furniture conservator/restorer and support  Supported Education Supported education helps our members receive the education and training they need to achieve their learning and recovery goals. This will assist members with becoming gainfully employed in the job or career of their choice. The program includes assistance with: Registering for disability accommodations Enrolling in school and registering for classes Learning communication skills Scheduling tutoring sessions within your school [facility] [employer] [facility] partners with Vocational Rehabilitation to help increase the success of clients seeking employment and educational goals.  Want to learn more about our programs?   Please contact our intake department INTAKE: [phone] Ext [phone]  Mailing: [address] [address]   [place], [place] [address]   [email or web address]     [facility]: Outpatient psychiatric Services:   Please see the walk in hours listed below.  Medication Management New Patient needing Medication Management Walk-in, and Existing Patients needing to see a provider for management coming as a walk in   Monday thru Friday 8:00 AM first come first serve until slots are full.  Recommend being there by 7:15 AM to ensure a slot is open.  Therapy New Patient Therapy Intake and Existing Patients needing to see therapist  coming in as a walk in.   Monday, Wednesday, and Thursday morning at 8:00 am first come first serve.  Recommend being there by 7:15 AM to ensure a slot is open.    Every 1st, 2nd, and 3rd Friday at 1:00 PM first come first serve until slots are full.  Will still need to come in that morning at 7:15 AM to get registered for an afternoon slot.  For all walk-ins we ask that you arrive by 7:15 am because patients will be seen in there order of arrival (FIRST COME FIRST SERVE) Availability is limited, therefore you may not be seen on the same day that you walk in if all slots are full.    Our goal is to serve and meet the needs of our community to the best of our ability.     You will need to go to [facility] to establish outpatient psychiatric services.  Information on [facility] listed below.        Address:  [address] [place], [place] [address] Outpatient Hours: Mon-Fri 8AM to 5PM Phone: [phone] Fax: [phone] Offers: Behavioral Health Urgent Care [facility]) Hours: 24/7 Phone: [phone] Fax: [phone] --- Address:  [address] [place], [place] [address] Hours: Mon-Fri 8AM-5PM Phone: [phone] Fax: [phone]   Services: Adult Services Advanced Access Walk-In Assessments - All Disabilities If you're a walk-in patient there is generally a wait time involved on a "first come, first served basis" otherwise contact us beforehand to setup an appointment. If you're in crisis please use our 24-Hour Crisis Hotline for immediate access to a clinician. If this is your first time, please bring the following with you:   Insurance/Medicaid Card  ID or Social Security Card Any referral documentation Routine Outpatient Therapy Psychiatry/Med Management Substance Abuse Intensive Outpatient (SAIOP) Medication Assisted Treatment - MAT (Suboxone) Tailored Care Management (TCM) Youth Services Advanced Access Walk-In Assessments - All Disabilities Routine Outpatient  Therapy Psychiatry/Med Management Tailored Care Management (TCM)   [facility] Health Outpatient Services [facility] Phone: [phone] Physical Address:  [address] [place], [place]  [address]  Outpatient Services Life can be a challenge for [place] all. [facility]'s outpatient services offer a caring and experienced team of professionals who help people take the first step, which is often the most difficult. Together, we develop a well-defined and customized plan for each person that meets the individual's needs and goals. Each plan includes evidence-based practices as proven strategies that work. From board-certified psychiatrists, registered nurses, therapists, and outpatient office administrative professionals--all care and want to help you and your loved ones in every way possible to ensure you succeed.  Open Access:   One way we ensure we get people the help they need when they request is is through Open Access. This service encourages individuals who are in dire need of our services and are new to [facility] to simply walk in or call us for virtual options, Monday through Friday between 8 a.m. and 3 p.m. On the same day of contact, if the individual has time to do so, he/she/they will complete patient registration and a comprehensive clinical assessment with a therapist. The assessment will provide treatment recommendations and the individual will leave with an appointment for the next service or a referral to the proper level of care.  While this process takes a few hours and is longer than a traditional appointment, it reduces what could otherwise be months of waiting for help or an appointment.   Telehealth Services:  [facility]'s telehealth services provide a safe, secure, and easy way to connect with a therapist or mental health provider for an individual or group therapy appointment. Click here to learn more about how [facility]'s telehealth services provide an important  treatment option. These services may be accessed from the comfort of an individual's home, or at one of [facility]'s behavioral health offices such as this one where an individual may use on-site equipment for the visit.   Telehealth Services   A SAFE, SECURE, CONVENIENT TREATMENT OPTION:  [facility] telehealth services provide you with a safe, secure, and easy way to connect with your therapist or mental health provider for an individual or group therapy appointment.  Using Psychologist, prison and probation services, telehealth appointments allow you to meet with [employer], therapists, nurse practitioners, and psychiatrists from your desktop or laptop computer, cell phone, or tablet device. Telehealth visits are compliant with all Health Insurance Portability and Accountability Act (HIPAA) requirements and you can complete a telehealth visit from just about anywhere using internet or wi-fi access.  HOW DOES IT WORK?  [facility] uses the Doxy[employer]me platform to host telehealth appointments. Prior to your scheduled visit, you will receive a direct link via text or email which will take you to your provider's online waiting room. Simply click that link at your appointment time and your provider will be notified that you've arrived. He or she will meet you online and you will complete your visit. Your provider may also have resources and information posted in his or her virtual waiting room which you may find helpful throughout your treatment.  In addition, you may receive a reminder telephone call from a [employer] [facility] team member in the days leading up to your appointment. During that call, you will have an opportunity to provide important health information and medication updates which may save time during your scheduled appointment.     WHO USES TELEHEALTH SERVICES?  Telehealth services provide an alternative to in-person, face-to-face treatment for individuals receiving outpatient behavioral health services. At [facility], telehealth  visits may also be used by individuals receiving Assertive Community Treatment (ACT) Team and Individual Placement and Support (IPS) services and other community-based, specialized services as needed. Telehealth services are also used for group therapy sessions, allowing people we support to connect during treatment with others who have similar experiences.  Substance Abuse Treatment Programs  Intensive Outpatient Programs [facility]     [address]      [place], [place]                   [phone]       [facility] [address] [address] [place], [place] [phone]  [facility]     (Inpatient and outpatient)     [address]           [phone]    [facility] [phone] (Suboxone and Methadone)  [address]      [place], [place] [address]      [phone]       [address] [place], [place] [phone]  [facility] (Outpatient/Inpatient, Chemical)    (insurance only) [phone]             [facility] (Groups & Residential) [place], [place] [phone]     [facility]     [address]     [place], [place]      [phone]  [facility] (for caregivers and family) [address] [place], [place] [phone]      Residential Treatment Programs [facility]      [address] [place], [place] [address]  [phone]       [facility] [address] [place], [place] [address] [phone]  [facility] [place]        [phone]       [facility] [phone]  [facility] ([facility]             [address]                                         [place], [place]                                                [phone] or [phone]                               [facility] of [place] [address] [place], [address] [phone]  [facility]    [address]      [place], [place]     [phone]       [facility] [address] [place], [place] [phone]  [facility]   [address] [place], [place] [address]     [phone]      Admissions: 8am-3pm M-F  Residential Treatment Services (RTS) [address] [place], [place] [phone]  [facility] Program: Residential Program [phone] Days)   [place], [place]      [phone] or [phone]     ADATC: [facility] [place], [place] (Walk in Hours over the weekend or by referral)  [facility] [address] [place], [place], [place] [address] [phone]  Crisis Mobile: [facility]:  [phone] (for crisis response 24 hours a day) [facility] Hotline:      9250701821 Outpatient Psychiatry and Counseling  [facility]: Mobile Crisis Management 24 hours:  [phone]  [facility] of the [employer] sliding scale fee and walk in schedule: M-F 8am-12pm/1pm-3pm [address]  [place], [place] [address] [phone]  [facility] [address] [place], [place] [address] [phone]  [facility] (Formerly [facility] as [facility] [employer])- new patient walk-in appointments available Monday - Friday 8am -3pm.          [address] [place], [place] [address] [phone] or crisis line- [phone]  [facility] Outpatient Services/ Intensive Outpatient Therapy Program [address] [place], [place] [address] [phone]  [facility]                  Crisis Services      [phone] [address]     [place], [place] [address]                 [facility]   [facility] [phone]. [address] [place], [place] [address]   [employer] of Care          [address],  [place], [place] [address]       [phone]  [facility] [address] [place], [place] [address] [phone]  [facility]    [address]    [place], [place] [address]     [phone]       Andee Poles, MD     [address]     [place] [address]     [phone]       [facility] [address] [place] [address]  [facility]     [address] [place], [place]      [phone]       [facility] Eulogio Ditch, MD [address] [place], [place] [address] [phone]  [facility]     [address]     [place], [place] [address]     [phone]       [facility] [address] [place], [place] [address] [phone] Resources for Temporary Residential Assistance/Crisis Centers  [facility]) M-F 8am-3pm   [address] [place], [place] [address]   [phone] Services include: laundry, barbering, support groups, case management, phone  & computer access, showers, AA/NA mtgs, mental health/substance abuse nurse, job skills class, disability information, VA assistance, spiritual classes, etc.   HOMELESS SHELTERS  [facility]     [facility]   [address] [place]     [phone]              [employer] (women and children)       [address] [place], [place] [address] [phone] [email or web address] for application and process Application Required  [facility] [employer] [facility]   [address]    [place] [address]     [phone]                    [facility] of [place] [address] [place], [place] [address] [phone] application appt.) Application Required  [facility] (women only)    [address]     [place], [place] [address]     [phone]      Intake starts 6pm daily Need valid ID, SSC, & Police  report [employer] [address] [place], [place] [phone] Application Required  [employer] (men only)     [address]      [place], [place]     [phone]       [facility] of the [place] (Pregnant women only) [address] [place], [place] [phone]  [facility]      [address]      [place], [place] [address]     [phone]             [facility] [address] [place], [place] [phone] 90 day commitment/SA/Application process  [facility](men only)     [address]     [place], [place]     [phone]       Check-in at [facility] [address] [place], [place] [address] [phone] Men/Women/Women and Children must be there by 7 pm  [facility] [place], [place] [phone]

## 2022-12-27 ENCOUNTER — Inpatient Hospital Stay (HOSPITAL_COMMUNITY)
Admission: EM | Admit: 2022-12-27 | Discharge: 2022-12-31 | DRG: 917 | Disposition: A | Discharge status: Other Acute Inpatient Hospital | Payer: MEDICAID | Attending: Pulmonary Disease | Admitting: Pulmonary Disease

## 2022-12-27 ENCOUNTER — Emergency Department (HOSPITAL_COMMUNITY): Payer: MEDICAID

## 2022-12-27 ENCOUNTER — Other Ambulatory Visit: Payer: Self-pay

## 2022-12-27 ENCOUNTER — Encounter (HOSPITAL_COMMUNITY): Payer: Self-pay

## 2022-12-27 ENCOUNTER — Inpatient Hospital Stay (HOSPITAL_COMMUNITY): Payer: MEDICAID

## 2022-12-27 DIAGNOSIS — T43014A Poisoning by tricyclic antidepressants, undetermined, initial encounter: Principal | ICD-10-CM

## 2022-12-27 DIAGNOSIS — T50902A Poisoning by unspecified drugs, medicaments and biological substances, intentional self-harm, initial encounter: Secondary | ICD-10-CM

## 2022-12-27 DIAGNOSIS — G9340 Encephalopathy, unspecified: Secondary | ICD-10-CM | POA: Diagnosis present

## 2022-12-27 DIAGNOSIS — G929 Unspecified toxic encephalopathy: Secondary | ICD-10-CM | POA: Diagnosis present

## 2022-12-27 DIAGNOSIS — Z9151 Personal history of suicidal behavior: Secondary | ICD-10-CM

## 2022-12-27 DIAGNOSIS — J9601 Acute respiratory failure with hypoxia: Secondary | ICD-10-CM | POA: Diagnosis present

## 2022-12-27 DIAGNOSIS — J690 Pneumonitis due to inhalation of food and vomit: Secondary | ICD-10-CM | POA: Diagnosis present

## 2022-12-27 DIAGNOSIS — R000 Tachycardia, unspecified: Secondary | ICD-10-CM | POA: Diagnosis present

## 2022-12-27 DIAGNOSIS — E876 Hypokalemia: Secondary | ICD-10-CM | POA: Diagnosis present

## 2022-12-27 DIAGNOSIS — F17210 Nicotine dependence, cigarettes, uncomplicated: Secondary | ICD-10-CM | POA: Diagnosis present

## 2022-12-27 DIAGNOSIS — R739 Hyperglycemia, unspecified: Secondary | ICD-10-CM | POA: Diagnosis present

## 2022-12-27 DIAGNOSIS — F333 Major depressive disorder, recurrent, severe with psychotic symptoms: Secondary | ICD-10-CM | POA: Diagnosis not present

## 2022-12-27 DIAGNOSIS — Y92009 Unspecified place in unspecified non-institutional (private) residence as the place of occurrence of the external cause: Secondary | ICD-10-CM

## 2022-12-27 DIAGNOSIS — R9431 Abnormal electrocardiogram [ECG] [EKG]: Secondary | ICD-10-CM | POA: Diagnosis present

## 2022-12-27 DIAGNOSIS — Z885 Allergy status to narcotic agent status: Secondary | ICD-10-CM | POA: Diagnosis not present

## 2022-12-27 DIAGNOSIS — Z781 Physical restraint status: Secondary | ICD-10-CM | POA: Diagnosis not present

## 2022-12-27 DIAGNOSIS — Z1152 Encounter for screening for COVID-19: Secondary | ICD-10-CM | POA: Diagnosis not present

## 2022-12-27 DIAGNOSIS — T43012A Poisoning by tricyclic antidepressants, intentional self-harm, initial encounter: Secondary | ICD-10-CM | POA: Diagnosis not present

## 2022-12-27 DIAGNOSIS — E873 Alkalosis: Secondary | ICD-10-CM | POA: Diagnosis present

## 2022-12-27 DIAGNOSIS — J9602 Acute respiratory failure with hypercapnia: Secondary | ICD-10-CM | POA: Diagnosis present

## 2022-12-27 DIAGNOSIS — J40 Bronchitis, not specified as acute or chronic: Secondary | ICD-10-CM | POA: Diagnosis present

## 2022-12-27 DIAGNOSIS — I10 Essential (primary) hypertension: Secondary | ICD-10-CM | POA: Diagnosis present

## 2022-12-27 DIAGNOSIS — Z91148 Patient's other noncompliance with medication regimen for other reason: Secondary | ICD-10-CM | POA: Diagnosis not present

## 2022-12-27 DIAGNOSIS — R569 Unspecified convulsions: Secondary | ICD-10-CM | POA: Diagnosis not present

## 2022-12-27 HISTORY — DX: Other symptoms and signs involving appearance and behavior: R46.89

## 2022-12-27 HISTORY — DX: Homelessness unspecified: Z59.00

## 2022-12-27 HISTORY — DX: Mental disorder, not otherwise specified: F99

## 2022-12-27 HISTORY — DX: Suicidal ideations: R45.851

## 2022-12-27 HISTORY — DX: Hepatitis a without hepatic coma: B15.9

## 2022-12-27 HISTORY — DX: Nicotine dependence, unspecified, uncomplicated: F17.200

## 2022-12-27 HISTORY — DX: Poisoning by unspecified drugs, medicaments and biological substances, intentional self-harm, initial encounter: T50.902A

## 2022-12-27 HISTORY — DX: Major depressive disorder, single episode, unspecified: F32.9

## 2022-12-27 HISTORY — DX: Housing instability, housed unspecified: Z59.819

## 2022-12-27 HISTORY — DX: Intermittent explosive disorder: F63.81

## 2022-12-27 HISTORY — DX: Bipolar disorder, unspecified: F31.9

## 2022-12-27 HISTORY — DX: Brief psychotic disorder: F23

## 2022-12-27 HISTORY — DX: Conviction in civil and criminal proceedings without imprisonment: Z65.0

## 2022-12-27 HISTORY — DX: Alcohol abuse, uncomplicated: F10.10

## 2022-12-27 HISTORY — DX: Tobacco use: Z72.0

## 2022-12-27 HISTORY — DX: Cannabis abuse, uncomplicated: F12.10

## 2022-12-27 HISTORY — DX: Other psychoactive substance abuse, uncomplicated: F19.10

## 2022-12-27 HISTORY — DX: Unemployment, unspecified: Z56.0

## 2022-12-27 LAB — I-STAT CHEM 8, ED
BUN: 6 mg/dL (ref 6–20)
Calcium, Ion: 0.98 mmol/L — ABNORMAL LOW (ref 1.15–1.40)
Chloride: 98 mmol/L (ref 98–111)
Creatinine, Ser: 1 mg/dL (ref 0.61–1.24)
Glucose, Bld: 169 mg/dL — ABNORMAL HIGH (ref 70–99)
HCT: 40 % (ref 39.0–52.0)
Hemoglobin: 13.6 g/dL (ref 13.0–17.0)
Potassium: 3.2 mmol/L — ABNORMAL LOW (ref 3.5–5.1)
Sodium: 148 mmol/L — ABNORMAL HIGH (ref 135–145)
TCO2: 33 mmol/L — ABNORMAL HIGH (ref 22–32)

## 2022-12-27 LAB — I-STAT ARTERIAL BLOOD GAS, ED
Acid-Base Excess: 7 mmol/L — ABNORMAL HIGH (ref 0.0–2.0)
Bicarbonate: 30.2 mmol/L — ABNORMAL HIGH (ref 20.0–28.0)
Calcium, Ion: 1.08 mmol/L — ABNORMAL LOW (ref 1.15–1.40)
HCT: 48 % (ref 39.0–52.0)
Hemoglobin: 16.3 g/dL (ref 13.0–17.0)
O2 Saturation: 100 %
Potassium: 3.4 mmol/L — ABNORMAL LOW (ref 3.5–5.1)
Sodium: 144 mmol/L (ref 135–145)
TCO2: 31 mmol/L (ref 22–32)
pCO2 arterial: 36.9 mm[Hg] (ref 32–48)
pH, Arterial: 7.52 — ABNORMAL HIGH (ref 7.35–7.45)
pO2, Arterial: 369 mm[Hg] — ABNORMAL HIGH (ref 83–108)

## 2022-12-27 LAB — RAPID URINE DRUG SCREEN, HOSP PERFORMED
Amphetamines: NOT DETECTED
Barbiturates: NOT DETECTED
Benzodiazepines: NOT DETECTED
Cocaine: NOT DETECTED
Opiates: NOT DETECTED
Tetrahydrocannabinol: NOT DETECTED

## 2022-12-27 LAB — I-STAT VENOUS BLOOD GAS, ED
Acid-Base Excess: 3 mmol/L — ABNORMAL HIGH (ref 0.0–2.0)
Bicarbonate: 26.9 mmol/L (ref 20.0–28.0)
Calcium, Ion: 1.05 mmol/L — ABNORMAL LOW (ref 1.15–1.40)
HCT: 46 % (ref 39.0–52.0)
Hemoglobin: 15.6 g/dL (ref 13.0–17.0)
O2 Saturation: 98 %
Potassium: 3.3 mmol/L — ABNORMAL LOW (ref 3.5–5.1)
Sodium: 143 mmol/L (ref 135–145)
TCO2: 28 mmol/L (ref 22–32)
pCO2, Ven: 38.5 mm[Hg] — ABNORMAL LOW (ref 44–60)
pH, Ven: 7.452 — ABNORMAL HIGH (ref 7.25–7.43)
pO2, Ven: 95 mm[Hg] — ABNORMAL HIGH (ref 32–45)

## 2022-12-27 LAB — URINALYSIS, ROUTINE W REFLEX MICROSCOPIC
Bacteria, UA: NONE SEEN
Bilirubin Urine: NEGATIVE
Glucose, UA: NEGATIVE mg/dL
Hgb urine dipstick: NEGATIVE
Ketones, ur: 5 mg/dL — AB
Leukocytes,Ua: NEGATIVE
Nitrite: NEGATIVE
Protein, ur: 30 mg/dL — AB
Specific Gravity, Urine: 1.01 (ref 1.005–1.030)
pH: 9 — ABNORMAL HIGH (ref 5.0–8.0)

## 2022-12-27 LAB — LACTIC ACID, PLASMA: Lactic Acid, Venous: 3.1 mmol/L (ref 0.5–1.9)

## 2022-12-27 LAB — COMPREHENSIVE METABOLIC PANEL
ALT: 21 U/L (ref 0–44)
AST: 17 U/L (ref 15–41)
Albumin: 3.5 g/dL (ref 3.5–5.0)
Alkaline Phosphatase: 34 U/L — ABNORMAL LOW (ref 38–126)
Anion gap: 10 (ref 5–15)
BUN: 7 mg/dL (ref 6–20)
CO2: 25 mmol/L (ref 22–32)
Calcium: 8.2 mg/dL — ABNORMAL LOW (ref 8.9–10.3)
Chloride: 106 mmol/L (ref 98–111)
Creatinine, Ser: 1.03 mg/dL (ref 0.61–1.24)
GFR, Estimated: >60 mL/min (ref >60)
Glucose, Bld: 179 mg/dL — ABNORMAL HIGH (ref 70–99)
Potassium: 3.3 mmol/L — ABNORMAL LOW (ref 3.5–5.1)
Sodium: 141 mmol/L (ref 135–145)
Total Bilirubin: 0.7 mg/dL (ref 0.3–1.2)
Total Protein: 5.7 g/dL — ABNORMAL LOW (ref 6.5–8.1)

## 2022-12-27 LAB — APTT: aPTT: 26 s (ref 24–36)

## 2022-12-27 LAB — CBC
HCT: 45.1 % (ref 39.0–52.0)
Hemoglobin: 15.2 g/dL (ref 13.0–17.0)
MCH: 28.8 pg (ref 26.0–34.0)
MCHC: 33.7 g/dL (ref 30.0–36.0)
MCV: 85.4 fL (ref 80.0–100.0)
Platelets: 185 10*3/uL (ref 150–400)
RBC: 5.28 MIL/uL (ref 4.22–5.81)
RDW: 12.4 % (ref 11.5–15.5)
WBC: 9.4 10*3/uL (ref 4.0–10.5)
nRBC: 0 % (ref 0.0–0.2)

## 2022-12-27 LAB — TYPE AND SCREEN
ABO/RH(D): A POS
Antibody Screen: NEGATIVE

## 2022-12-27 LAB — SALICYLATE LEVEL: Salicylate Lvl: <7 mg/dL — ABNORMAL LOW (ref 7.0–30.0)

## 2022-12-27 LAB — PROCALCITONIN: Procalcitonin: <0.1 ng/mL

## 2022-12-27 LAB — ACETAMINOPHEN LEVEL: Acetaminophen (Tylenol), Serum: <10 ug/mL — ABNORMAL LOW (ref 10–30)

## 2022-12-27 LAB — HIV ANTIBODY (ROUTINE TESTING W REFLEX): HIV Screen 4th Generation wRfx: NONREACTIVE

## 2022-12-27 LAB — PROTIME-INR
INR: 1.2 (ref 0.8–1.2)
Prothrombin Time: 15.2 s (ref 11.4–15.2)

## 2022-12-27 LAB — HEMOGLOBIN A1C
Hgb A1c MFr Bld: 4.9 % (ref 4.8–5.6)
Mean Plasma Glucose: 93.93 mg/dL

## 2022-12-27 LAB — SARS CORONAVIRUS 2 BY RT PCR: SARS Coronavirus 2 by RT PCR: NEGATIVE

## 2022-12-27 LAB — LITHIUM LEVEL: Lithium Lvl: <0.06 mmol/L — ABNORMAL LOW (ref 0.60–1.20)

## 2022-12-27 LAB — GLUCOSE, CAPILLARY: Glucose-Capillary: 125 mg/dL — ABNORMAL HIGH (ref 70–99)

## 2022-12-27 LAB — TROPONIN I (HIGH SENSITIVITY): Troponin I (High Sensitivity): 4 ng/L (ref <18)

## 2022-12-27 LAB — CK: Total CK: 54 U/L (ref 49–397)

## 2022-12-27 LAB — MAGNESIUM: Magnesium: 1.6 mg/dL — ABNORMAL LOW (ref 1.7–2.4)

## 2022-12-27 LAB — T4, FREE: Free T4: 1.64 ng/dL — ABNORMAL HIGH (ref 0.61–1.12)

## 2022-12-27 LAB — CORTISOL: Cortisol, Plasma: 33.9 ug/dL

## 2022-12-27 LAB — ABO/RH: ABO/RH(D): A POS

## 2022-12-27 LAB — VITAMIN B12: Vitamin B-12: 169 pg/mL — ABNORMAL LOW (ref 180–914)

## 2022-12-27 LAB — ETHANOL: Alcohol, Ethyl (B): <10 mg/dL (ref <10)

## 2022-12-27 LAB — TSH: TSH: 1.072 u[IU]/mL (ref 0.350–4.500)

## 2022-12-27 LAB — CBG MONITORING, ED
Glucose-Capillary: 172 mg/dL — ABNORMAL HIGH (ref 70–99)
Glucose-Capillary: 139 mg/dL — ABNORMAL HIGH (ref 70–99)

## 2022-12-27 LAB — AMMONIA: Ammonia: 21 umol/L (ref 9–35)

## 2022-12-27 MED ORDER — POTASSIUM CHLORIDE 10 MEQ/100ML IV SOLN
10.0000 meq | Freq: Once | INTRAVENOUS | Status: DC
Start: 2022-12-27 — End: 2022-12-27

## 2022-12-27 MED ORDER — PROPOFOL 1000 MG/100ML IV EMUL
5.0000 ug/kg/min | INTRAVENOUS | Status: DC
Start: 2022-12-27 — End: 2022-12-29
  Administered 2022-12-27: 5 ug/kg/min via INTRAVENOUS
  Administered 2022-12-28 (×3): 60 ug/kg/min via INTRAVENOUS
  Administered 2022-12-28: 20 ug/kg/min via INTRAVENOUS
  Administered 2022-12-28 (×2): 70 ug/kg/min via INTRAVENOUS
  Administered 2022-12-29: 50 ug/kg/min via INTRAVENOUS
  Administered 2022-12-29: 60 ug/kg/min via INTRAVENOUS
  Administered 2022-12-29: 50 ug/kg/min via INTRAVENOUS
  Filled 2022-12-27 (×10): qty 100

## 2022-12-27 MED ORDER — CHLORHEXIDINE GLUCONATE CLOTH 2 % EX PADS
6.0000 | MEDICATED_PAD | Freq: Every day | CUTANEOUS | Status: DC
  Administered 2022-12-27 – 2022-12-30 (×5): 6 via TOPICAL

## 2022-12-27 MED ORDER — ORAL CARE MOUTH RINSE
15.0000 mL | OROMUCOSAL | Status: DC
  Administered 2022-12-28 (×9): 15 mL via OROMUCOSAL

## 2022-12-27 MED ORDER — SODIUM BICARBONATE 8.4 % IV SOLN
50.0000 meq | Freq: Once | INTRAVENOUS | Status: AC
  Administered 2022-12-27: 50 meq via INTRAVENOUS

## 2022-12-27 MED ORDER — POTASSIUM CHLORIDE 10 MEQ/100ML IV SOLN
10.0000 meq | Freq: Once | INTRAVENOUS | Status: AC
  Administered 2022-12-27: 10 meq via INTRAVENOUS

## 2022-12-27 MED ORDER — SODIUM CHLORIDE 0.9 % IV SOLN
2.0000 g | INTRAVENOUS | Status: DC
  Administered 2022-12-27 – 2022-12-30 (×4): 2 g via INTRAVENOUS
  Filled 2022-12-27 (×4): qty 20

## 2022-12-27 MED ORDER — ACTIDOSE WITH SORBITOL 50 GM/240ML PO SUSP: 50.0000 g | Freq: Once | ORAL | Status: DC

## 2022-12-27 MED ORDER — PANTOPRAZOLE SODIUM 40 MG IV SOLR
40.0000 mg | Freq: Every day | INTRAVENOUS | Status: DC
  Administered 2022-12-27 – 2022-12-28 (×2): 40 mg via INTRAVENOUS
  Filled 2022-12-27 (×2): qty 10

## 2022-12-27 MED ORDER — POTASSIUM CHLORIDE 10 MEQ/100ML IV SOLN: 10.0000 meq | Freq: Once | INTRAVENOUS | Status: DC

## 2022-12-27 MED ORDER — FAMOTIDINE 20 MG PO TABS: 20.0000 mg | ORAL_TABLET | Freq: Two times a day (BID) | ORAL | Status: DC

## 2022-12-27 MED ORDER — INSULIN ASPART 100 UNIT/ML IJ SOLN
0.0000 [IU] | INTRAMUSCULAR | Status: DC
  Administered 2022-12-27 – 2022-12-28 (×4): 1 [IU] via SUBCUTANEOUS

## 2022-12-27 MED ORDER — MAGNESIUM SULFATE 2 GM/50ML IV SOLN
2.0000 g | Freq: Once | INTRAVENOUS | Status: AC
  Administered 2022-12-27: 2 g via INTRAVENOUS
  Filled 2022-12-27: qty 50

## 2022-12-27 MED ORDER — ORAL CARE MOUTH RINSE: 15.0000 mL | OROMUCOSAL | Status: DC | PRN

## 2022-12-27 MED ORDER — POTASSIUM CHLORIDE 10 MEQ/100ML IV SOLN
10.0000 meq | INTRAVENOUS | Status: AC
  Administered 2022-12-27 (×3): 10 meq via INTRAVENOUS
  Filled 2022-12-27 (×3): qty 100

## 2022-12-27 MED ORDER — POTASSIUM CHLORIDE 20 MEQ PO PACK
20.0000 meq | PACK | Freq: Once | ORAL | Status: AC
  Administered 2022-12-27: 20 meq
  Filled 2022-12-27: qty 1

## 2022-12-27 MED ORDER — CALCIUM GLUCONATE-NACL 1-0.675 GM/50ML-% IV SOLN
1.0000 g | Freq: Once | INTRAVENOUS | Status: AC
  Administered 2022-12-27: 1000 mg via INTRAVENOUS
  Filled 2022-12-27: qty 50

## 2022-12-27 MED ORDER — POLYETHYLENE GLYCOL 3350 17 G PO PACK: 17.0000 g | PACK | Freq: Every day | ORAL | Status: DC | PRN

## 2022-12-27 MED ORDER — CHARCOAL ACTIVATED PO LIQD
50.0000 g | Freq: Once | ORAL | Status: AC
  Administered 2022-12-27: 50 g
  Filled 2022-12-27: qty 240

## 2022-12-27 MED ORDER — DOCUSATE SODIUM 50 MG/5ML PO LIQD: 100.0000 mg | Freq: Two times a day (BID) | ORAL | Status: DC | PRN

## 2022-12-27 MED ORDER — POTASSIUM CHLORIDE 10 MEQ/100ML IV SOLN
10.0000 meq | Freq: Once | INTRAVENOUS | Status: DC
  Filled 2022-12-27: qty 100

## 2022-12-27 MED ORDER — STERILE WATER FOR INJECTION IV SOLN
INTRAVENOUS | Status: AC
  Filled 2022-12-27: qty 1000

## 2022-12-27 NOTE — ED Triage Notes (Signed)
Pt BIB EMS due to OD. Empty pill bottle of amitriptyline found on scene. Initially HR 160 with wide QRS. Pt arrives intubated. EMS gave 100 of sodium bicarb, of fentanyl, asnd 1500 cc of NS.

## 2022-12-27 NOTE — ED Provider Notes (Signed)
[place] EMERGENCY DEPARTMENT AT [facility] Provider Note   CSN: [id number] Arrival date & time: 12/27/22  1854     History  Chief Complaint  Patient presents with   Drug Overdose    Patrick Washington. is a 32 y.o. male with unknown past medical history who presents by EMS for unresponsiveness and suspected TCA overdose.  Patient was found in a house by a bystander unresponsive next to an open bottle of amitriptyline was empty.  Police and EMS were called.  EMS got there patient was unresponsive, tachycardic to 160 with a wide-complex.  He was intubated for airway protection and hypoventilation.  Airway was reportedly difficult with vomit and secretions.  Patient was given 100 of bicarb prior to arrival.  The history is provided by medical records and the EMS personnel. The history is limited by the condition of the patient. No language interpreter was used.       Home Medications Prior to Admission medications   Not on File      Allergies    Patient has no allergy information on record.    Review of Systems   Review of Systems unable to obtain  Physical Exam Updated Vital Signs BP (!) 124/92   Pulse (!) 113   Temp (!) 97.5 F (36.4 C)   Resp 20   Ht 5\' 10"  (1.778 m)   Wt 90.7 kg   SpO2 100%   BMI 28.70 kg/m  Physical Exam Constitutional:      Comments: Obtunded  HENT:     Head: Normocephalic and atraumatic.     Mouth/Throat:     Mouth: Mucous membranes are moist.     Comments: Intubated Eyes:     Comments: Disconjugate gaze with mid dilated unresponsive pupils  Cardiovascular:     Rate and Rhythm: Regular rhythm. Tachycardia present.     Pulses: Normal pulses.     Heart sounds: Normal heart sounds.  Pulmonary:     Comments: Intubated mechanically ventilated.  No spontaneous respirations. Equal breath sounds Abdominal:     General: There is no distension.     Palpations: Abdomen is soft.     Tenderness: There is no guarding or rebound.   Musculoskeletal:        General: No tenderness or signs of injury.     Cervical back: Neck supple.     Right lower leg: No edema.     Left lower leg: No edema.  Skin:    General: Skin is warm and dry.  Neurological:     Comments: Obtunded. No pupil response. No response to pain. GCS 3     ED Results / Procedures / Treatments   Labs (all labs ordered are listed, but only abnormal results are displayed) Labs Reviewed  COMPREHENSIVE METABOLIC PANEL - Abnormal; Notable for the following components:      Result Value   Potassium 3.3 (*)    Glucose, Bld 179 (*)    Calcium 8.2 (*)    Total Protein 5.7 (*)    Alkaline Phosphatase 34 (*)    All other components within normal limits  SALICYLATE LEVEL - Abnormal; Notable for the following components:   Salicylate Lvl <7.0 (*)    All other components within normal limits  ACETAMINOPHEN LEVEL - Abnormal; Notable for the following components:   Acetaminophen (Tylenol), Serum <10 (*)    All other components within normal limits  LITHIUM LEVEL - Abnormal; Notable for the following components:  Lithium Lvl <0.06 (*)    All other components within normal limits  MAGNESIUM - Abnormal; Notable for the following components:   Magnesium 1.6 (*)    All other components within normal limits  URINALYSIS, ROUTINE W REFLEX MICROSCOPIC - Abnormal; Notable for the following components:   APPearance HAZY (*)    pH 9.0 (*)    Ketones, ur 5 (*)    Protein, ur 30 (*)    All other components within normal limits  LACTIC ACID, PLASMA - Abnormal; Notable for the following components:   Lactic Acid, Venous 3.1 (*)    All other components within normal limits  T4, FREE - Abnormal; Notable for the following components:   Free T4 1.64 (*)    All other components within normal limits  VITAMIN B12 - Abnormal; Notable for the following components:   Vitamin B-12 169 (*)    All other components within normal limits  I-STAT VENOUS BLOOD GAS, ED - Abnormal;  Notable for the following components:   pH, Ven 7.452 (*)    pCO2, Ven 38.5 (*)    pO2, Ven 95 (*)    Acid-Base Excess 3.0 (*)    Potassium 3.3 (*)    Calcium, Ion 1.05 (*)    All other components within normal limits  CBG MONITORING, ED - Abnormal; Notable for the following components:   Glucose-Capillary 172 (*)    All other components within normal limits  I-STAT CHEM 8, ED - Abnormal; Notable for the following components:   Sodium 148 (*)    Potassium 3.2 (*)    Glucose, Bld 169 (*)    Calcium, Ion 0.98 (*)    TCO2 33 (*)    All other components within normal limits  I-STAT ARTERIAL BLOOD GAS, ED - Abnormal; Notable for the following components:   pH, Arterial 7.520 (*)    pO2, Arterial 369 (*)    Bicarbonate 30.2 (*)    Acid-Base Excess 7.0 (*)    Potassium 3.4 (*)    Calcium, Ion 1.08 (*)    All other components within normal limits  CBG MONITORING, ED - Abnormal; Notable for the following components:   Glucose-Capillary 139 (*)    All other components within normal limits  SARS CORONAVIRUS 2 BY RT PCR  CULTURE, BLOOD (ROUTINE X 2)  CULTURE, BLOOD (ROUTINE X 2)  CULTURE, RESPIRATORY W GRAM STAIN  URINE CULTURE  ETHANOL  CBC  RAPID URINE DRUG SCREEN, HOSP PERFORMED  CK  HIV ANTIBODY (ROUTINE TESTING W REFLEX)  PROCALCITONIN  PROTIME-INR  APTT  HEMOGLOBIN A1C  AMMONIA  TSH  TRIGLYCERIDES  CORTISOL  PHOSPHORUS  BLOOD GAS, ARTERIAL  CBC WITH DIFFERENTIAL/PLATELET  ACETAMINOPHEN LEVEL  SALICYLATE LEVEL  CK  COMPREHENSIVE METABOLIC PANEL  BLOOD GAS, ARTERIAL  BASIC METABOLIC PANEL  MAGNESIUM  I-STAT CHEM 8, ED  TYPE AND SCREEN  ABO/RH  TROPONIN I (HIGH SENSITIVITY)  TROPONIN I (HIGH SENSITIVITY)    EKG EKG Interpretation Date/Time:  Friday December 27 2022 20:26:14 EDT Ventricular Rate:  120 PR Interval:  130 QRS Duration:  132 QT Interval:  345 QTC Calculation: 488 R Axis:   243  Text Interpretation: Sinus tachycardia Nonspecific IVCD with LAD  Inferior infarct, old Confirmed by Glyn Ade [phone]) on 12/27/2022 10:21:34 PM  Radiology DG Abdomen 1 View  Result Date: 12/27/2022 CLINICAL DATA:  OG tube placement EXAM: ABDOMEN - 1 VIEW COMPARISON:  None Available. FINDINGS: Esophageal tube tip looped back upon itself in the body  of the stomach with the tip directed to the patient's left. Round calcification in the right upper quadrant is probably a gallstone. Moderate air distension of the stomach IMPRESSION: Esophageal tube tip looped back upon itself in the body of the stomach, the tip is directed to the patient's left. Electronically Signed   By: Jasmine Pang M.D.   On: 12/27/2022 22:01   DG Chest Portable 1 View  Result Date: 12/27/2022 CLINICAL DATA:  Drug overdose, OG tube placement EXAM: PORTABLE CHEST 1 VIEW COMPARISON:  07/13/2020 FINDINGS: Endotracheal tube tip in the mid intrathoracic trachea 5.8 cm from the carina. On the final image at 7:23 p.m. the enteric tube tip is in the distal esophagus with side port in the mid esophagus. The lungs are clear. Stable cardiomediastinal silhouette. No displaced rib fractures. Presumed cholelithiasis. IMPRESSION: 1. Enteric tube tip in the distal esophagus and side port in the mid esophagus. Recommend advancement approximately 13 cm. 2. Endotracheal tube tip 5.8 cm from the carina. Electronically Signed   By: Minerva Fester M.D.   On: 12/27/2022 20:46    Procedures Procedures    Medications Ordered in ED Medications  sodium bicarbonate 150 mEq in sterile water 1,150 mL infusion ( Intravenous New Bag/Given 12/27/22 1936)  potassium chloride 10 mEq in 100 mL IVPB (10 mEq Intravenous New Bag/Given 12/27/22 2207)  propofol (DIPRIVAN) 1000 MG/100ML infusion (5 mcg/kg/min  90.7 kg Intravenous New Bag/Given 12/27/22 2102)  cefTRIAXone (ROCEPHIN) 2 g in sodium chloride 0.9 % 100 mL IVPB (0 g Intravenous Stopped 12/27/22 2120)  docusate (COLACE) 50 MG/5ML liquid 100 mg (has no  administration in time range)  polyethylene glycol (MIRALAX / GLYCOLAX) packet 17 g (has no administration in time range)  pantoprazole (PROTONIX) injection 40 mg (40 mg Intravenous Given 12/27/22 2204)  insulin aspart (novoLOG) injection 0-9 Units (1 Units Subcutaneous Given 12/27/22 2202)  sodium bicarbonate injection 50 mEq (50 mEq Intravenous Given 12/27/22 1902)  sodium bicarbonate injection 50 mEq (50 mEq Intravenous Given 12/27/22 1902)  sodium bicarbonate injection 50 mEq (50 mEq Intravenous Given 12/27/22 1905)  sodium bicarbonate injection 50 mEq (50 mEq Intravenous Given 12/27/22 1905)  potassium chloride 10 mEq in 100 mL IVPB (0 mEq Intravenous Stopped 12/27/22 2056)  calcium gluconate 1 g/ 50 mL sodium chloride IVPB (0 mg Intravenous Stopped 12/27/22 2201)  magnesium sulfate IVPB 2 g 50 mL (0 g Intravenous Stopped 12/27/22 2211)  potassium chloride (KLOR-CON) packet 20 mEq (20 mEq Per Tube Given 12/27/22 2241)  charcoal activated (NO SORBITOL) (ACTIDOSE-AQUA) suspension 50 g (50 g Per Tube Given 12/27/22 2242)    ED Course/ Medical Decision Making/ A&P Clinical Course as of 12/27/22 2251  Fri Dec 27, 2022  1911 TCA OD 9g. [CC]  1912 [CC]    Clinical Course User Index [CC] Glyn Ade, MD                                 Medical Decision Making Amount and/or Complexity of Data Reviewed Independent Historian: EMS Labs: ordered. Decision-making details documented in ED Course. Radiology: ordered and independent interpretation performed. Decision-making details documented in ED Course. ECG/medicine tests: ordered and independent interpretation performed. Decision-making details documented in ED Course.  Risk Prescription drug management. Drug therapy requiring intensive monitoring for toxicity. Decision regarding hospitalization.   26M arrives obtunded and intubated after being found down after presumed amitryptilene overdose. Initial neuro exam with GCS 3,  though  later he was seen moving an upper extremity, and had a response to pain with OG tube insertion. Bottle found by patient (with his name on it) shows Amitryptilene 100 mg last filled in June 2024 with a total of 90 pills. Unknown ingestion time or amount, but maximum would have been 9 grams. Unknown if coingestants. Differential considered includes TCA OD, other OD or intoxication, anoxic brain injury, aspiration/choking event, brain bleed, hypoglycemia. Glucose 172 on arrival. Initial vitals afebrile, tachycardic 120s, normotensive, satting low 90s on ventilator at 100% FiO2 PEEP of 5; breath sounds symmetric. Initial EKG shows sinus tach with a QRS of 144 and Qtc of 664 (after 100 mEq bicarb with EMS). 100 mEq bicarb given and 2 min later repeat EKG with QRS 142, Qtc 533. 100 mEq bicarb again given (total of 300) and 2 min later QRS now 136 (<140 goal) and Qtc 564. Sodium on ISTAT post bicarb is high at 148 but appropriate. pH is 7.45 initially. Bicarb drip started at 50 mEq/hr. Repeat EKG 20 min later shows still appropriate QRS of <140.   Coingestant and other labs show negative serum tox, negative UDS, no UTI or hematuria on UA, no leukocytosis, normal hemoglobin, K 3.3 (repleted in ED), normal coags, normal CK, lactate 3.1. CXR shows ETT 5 cm above carina and OG tube placed high; was repositioned and placed properly confirmed on KUB.   I called poison control. They recommended d/c bicarb drip but after discussing with ICU attending will continue for now. Poison control recommended q2hr EKG, goal QRS <140 and prn bicarb boluses to maintain, one dose of activated charcoal given he is intubated, avoidance of beta blockers; and if hypotensive then fluids/pressors; if seizes benzos, if codes then lidocaine for ventricular arrhythmia and intralipid therapy (information received via fax). Goal Mag and K to be high normal. I discussed the patient and poison control recommendations with ICU attending. He was  then accepted for transfer to the ICU.          Final Clinical Impression(s) / ED Diagnoses Final diagnoses:  TCA (tricyclic antidepressant) overdose of undetermined intent, initial encounter    Rx / DC Orders ED Discharge Orders     None         Karmen Stabs, MD 12/27/22 2252    Glyn Ade, MD 12/27/22 2337

## 2022-12-27 NOTE — Progress Notes (Signed)
As per RN, pt headed to CT. Should be back in about 45 minutes. Will reattempts as schedule allows.

## 2022-12-27 NOTE — H&P (Signed)
NAME:  Patrick Delaossa., MRN:  [id number], DOB:  June 09, 1990, LOS: 0 ADMISSION DATE:  12/27/2022, CONSULTATION DATE:  12/27/22 REFERRING MD:  Nila Nephew CHIEF COMPLAINT:  Acute Encephalopathy   History of Present Illness:  32 y.o. male brought in by EMS.  Patient was found on the scene unconscious.  A bottle of amitriptyline 100 mg was found on the scene that was empty.  This was reportedly filled in June 2024 with a total of 90 pills.  Initial heart rate was 160 with wide QRS.  Patient was intubated on the scene by EMS and was noted to have emesis in his airway.  EMS administered 2 amps Sodium Bicarb, NS bolus, & Fentanyl 50 mcg prior to arrival per documentation of ED triage nurse.  Patient's past medical history is unclear.  ED provider reports patient arrives obtunded with GCS of 3.  Patient's glucose was 172 on arrival.  He was normotensive and had normal saturation on 100% FiO2 on arrival.  With prolonged QTc and wide QRS after discussing the case with poison control and additional total of 200 mEq of sodium Bicarb was administered and patient was started on a Bicab drip at 50 cc/hr. this resulted in narrowing of his QRS but still with a prolonged QTc.  Activated charcoal was recommended by poison control but with inability to establish OG tube this had not been given at the time of consultation.  Initial workup revealed hypokalemia and hypocalcemia.  Significant Hospital Events: Including procedures, antibiotic start and stop dates in addition to other pertinent events   10/25 - Admit  Interim History / Subjective:  N/A  Objective   Blood pressure (!) 148/106, pulse (!) 121, temperature (!) 95.9 F (35.5 C), resp. rate 20, height 5\' 10"  (1.778 m), weight 90.7 kg, SpO2 100%.    Vent Mode: PRVC FiO2 (%):  [100 %] 100 % Set Rate:  [20 bmp] 20 bmp Vt Set:  [560 mL] 560 mL PEEP:  [5 cmH20] 5 cmH20 Plateau Pressure:  [22 cmH20] 22 cmH20  No intake or output data in the 24 hours ending  12/27/22 2025 Filed Weights   12/27/22 1912  Weight: 90.7 kg    Examination: General: Caucasian male. No acute distress.  No family at bedside.  Integument:  Warm & dry. No rash or bruising on exposed skin.  Tattoos noted.  Healed laceration noted on left shoulder. Extremities:  No cyanosis or clubbing.  Lymphatics:  No appreciated cervical or supraclavicular lymphadenoapthy. HEENT:  Moist mucus membranes. No scleral injection or icterus. Endotracheal tube in place.  Cervical collar in place. Cardiovascular: Tachycardic.  Regular rhythm with sinus tachycardia noted on telemetry.  No appreciable JVD.  No murmur appreciated. Pulmonary:  Good aeration. Clear to auscultation bilaterally. Symmetric chest wall expansion on ventilator.  Abdomen: Soft. Normoactive bowel sounds. Protuberant.  No voluntary guarding. Musculoskeletal:  Normal bulk. No joint deformity or effusion appreciated. Neurological:  Examined on sedation without any sedation or paralytics.  Symmetric face.  Pupils symmetric and non-reactive.  Patient did move his right arm at one point spontaneously.  No withdrawal to pain in extremities.  Deep tendon patellar and biceps tendon reflexes are 0.  Bilateral lower extremity triple flexion is present.  No spontaneous respirations above ventilator set rate. Psychiatric: To assist with altered mental status.  Resolved Hospital Problem list   N/A  Assessment & Plan:  32 y.o. male with unknown past medical history.  Patient was found down and unresponsive.  He  was intubated in the field by EMS with obvious emesis in his airway.  He has suspected Elavil overdose given wide QRS and prolonged QTc with an empty bottle at his side.  Patient with a notably significantly abnormal physical exam at the time of admission.  1.  Acute encephalopathy: Suspected toxic encephalopathy secondary to overdose.  Checking stat CT imaging of the brain and cervical spine without contrast.  Ordering EEG as well.   Urine drug screen is pending.  Checking serum tonight, B12, TSH, free T4, and ammonia.  Plan to consider MRI imaging of the brain pending results of workup.  Initiating neurochecks every hour.  Starting patient on propofol drip given the potential for seizure activity with spontaneous movement of the right arm noted on exam.  Ordering seizure precautions.  Checking urine, blood, and respiratory cultures.  2.  Probable Elavil overdose: Poison control contacted by ED provider.  Patient received bolus sodium bicarbonate with improvement in QRS with and QTc.  Monitoring patient on telemetry.  Repeating EKG tomorrow morning.  Continuing bicarb drip at 50 cc/h while monitoring ABG closely.  UDS is pending.  Checking serum CK from previously collected labs.  Repeating serum CK, salicylate, and acetaminophen levels at midnight.  Reordering OG tube placement to administer activated charcoal as per poison control recommendations.  3.  Acute hypercarbic and hypoxic respiratory failure: Present on arrival and secondary to overdose.  Patient was intubated by EMS in the field.  Weaning FiO2 to maintain saturation greater than 92%.  Continuous pulse oximetry monitoring ongoing.  Treatment of aspiration as follows.  Holding on spontaneous breathing trials until patient's neurologic status improves.  4.  Aspiration tracheobronchitis: Emesis noted in patient's airway in the field.  No obvious focal opacity on x-ray imaging.  Checking stat CT chest without contrast.  Checking blood and respiratory cultures.  Initiating empiric antimicrobial therapy with Rocephin.  5.  Hypokalemia: Likely somewhat exacerbated by mild alkalosis.  Continuing telemetry monitoring.  KCl 20 mEq via tube & 20 mEq IV ordered by ED Provider.  Repeating electrolyte panel at midnight.  Repeating EKG in the morning.  6.  Hypomagnesemia: Replacing with magnesium sulfate 2 g IV.  Monitoring patient on telemetry.  Repeating serum magnesium level at  midnight.  Repeating EKG in the morning.  7.  Hypocalcemia: Replacing with calcium gluconate 1 g IV.  Monitoring calcium with skin or electrolyte panels.  Continuing telemetry monitoring.  Repeating EKG in the morning.  8.  Hyperglycemia: Checking hemoglobin A1c.  Monitoring blood glucose with Accu-Cheks every 4 hours scheduled and treating with low-dose sliding scale insulin as per algorithm.  9.  CODE STATUS: Consulting medical social worker to locate family/next of kin.  As there is no one available patient will be full code by default until this can be further clarified.  10.  Disposition: Admitting patient to ICU for further stabilization, workup, and treatment.  Best Practice (right click and "Reselect all SmartList Selections" daily)   Diet/type: NPO DVT prophylaxis: SCD GI prophylaxis: PPI Lines: N/A Foley:  Yes, and it is still needed Code Status:  full code Last date of multidisciplinary goals of care discussion [Pending]  Labs   CBC: Recent Labs  Lab 12/27/22 1856 12/27/22 1905 12/27/22 1912  WBC 9.4  --   --   HGB 15.2 15.6 13.6  HCT 45.1 46.0 40.0  MCV 85.4  --   --   PLT 185  --   --     Basic Metabolic Panel:  Recent Labs  Lab 12/27/22 1856 12/27/22 1905 12/27/22 1912  NA 141 143 148*  K 3.3* 3.3* 3.2*  CL 106  --  98  CO2 25  --   --   GLUCOSE 179*  --  169*  BUN 7  --  6  CREATININE 1.03  --  1.00  CALCIUM 8.2*  --   --    GFR: Estimated Creatinine Clearance: 120.2 mL/min (by C-G formula based on SCr of 1 mg/dL). Recent Labs  Lab 12/27/22 1856  WBC 9.4    Liver Function Tests: Recent Labs  Lab 12/27/22 1856  AST 17  ALT 21  ALKPHOS 34*  BILITOT 0.7  PROT 5.7*  ALBUMIN 3.5   No results for input(s): "LIPASE", "AMYLASE" in the last 168 hours. No results for input(s): "AMMONIA" in the last 168 hours.  ABG    Component Value Date/Time   HCO3 26.9 12/27/2022 1905   TCO2 33 (H) 12/27/2022 1912   O2SAT 98 12/27/2022 1905      Coagulation Profile: No results for input(s): "INR", "PROTIME" in the last 168 hours.  Cardiac Enzymes: No results for input(s): "CKTOTAL", "CKMB", "CKMBINDEX", "TROPONINI" in the last 168 hours.  HbA1C: No results found for: "HGBA1C"  CBG: Recent Labs  Lab 12/27/22 1903  GLUCAP 172*   TOXICOLOGY (12/27/22): UDS >>> Pending CK >>> Pending Acteaminophen:  <10 Lithium:  <0.06 Salicylate:  <7 EtOH:  <10  IMAGING: PORT CXR 12/27/22 (personally reviewed by me): Imaging semirecumbent. Orogastric tube tip remains in the distal esophagus.  Endotracheal tube tip is approximately 5.6 cm above the carina.  No focal mass or opacity.  No pneumothorax.  No pleural effusion.  Borderline cardiomegaly.  Trachea midline.  Normal mediastinal contour.  Review of Systems:   Unable to obtain an accurate review of systems given patient's intubation and altered mental status.  Past Medical History:  Unable to obtain a past medical history given patient's intubation and altered mental status.  Surgical History:  Unable to obtain a past surgical history given patient's intubation and altered mental status.  Social History:  Unable to obtain a social history given patient's intubation and altered mental status.  Family History:  Unable to obtain a family history given patient's intubation and altered mental status.  80  Allergies Not on File   Home Medications  Prior to Admission medications   Not on File     Critical care time:  I have spent a total of 48 minutes of critical care time today managing the patient's acute encephalopathy and acute hypoxic respiratory failure secondary to aspiration as a result of probable Elavil overdose necessitating emergent intubation and full support with mechanical ventilation independent of time spent during procedures caring for the patient, discussing plan of care with staff at bedside, and reviewing the patient's electronic medical record.  The  patient's prognosis is guarded.  His risk for further complications and death due to his critical illness remains high.    Donna Christen Jamison Neighbor, M.D. [facility] 8:55 PM 12/27/22   Please see Amion for pager details.  From 7A-7P if no response please call [phone] After hours, please call ELink [phone]

## 2022-12-27 NOTE — Progress Notes (Signed)
Eeg attempted twice, pt going to CT both times. Pt currently at ct, to be moved to different room after. Will attempt as schedule allows

## 2022-12-27 NOTE — Procedures (Signed)
Patient Name: Patrick Washington.  MRN: [id number]  Epilepsy Attending: Charlsie Quest  Referring Physician/Provider: Roslynn Amble, MD  Date: 12/28/2022 Duration: 21.46 mins  Patient history: 32yo M with ams getting eeg to evaluate for seizure  Level of alertness:  comatose  AEDs during EEG study: Propofol  Technical aspects: This EEG study was done with scalp electrodes positioned according to the 10-20 International system of electrode placement. Electrical activity was reviewed with band pass filter of 1-70Hz , sensitivity of 7 uV/mm, display speed of 61mm/sec with a 60Hz  notched filter applied as appropriate. EEG data were recorded continuously and digitally stored.  Video monitoring was available and reviewed as appropriate.  Description: EEG showed continuous generalized 5 to 6 Hz theta slowing admixed with intermittent generalized 13-15hz  beta activity. Hyperventilation and photic stimulation were not performed.     ABNORMALITY - Continuous slow, generalized  IMPRESSION: This study is suggestive of moderate diffuse encephalopathy. No seizures or epileptiform discharges were seen throughout the recording.  Patrick Washington [name]

## 2022-12-28 ENCOUNTER — Inpatient Hospital Stay (HOSPITAL_COMMUNITY): Payer: MEDICAID

## 2022-12-28 ENCOUNTER — Encounter (HOSPITAL_COMMUNITY): Payer: Self-pay | Admitting: Pulmonary Disease

## 2022-12-28 DIAGNOSIS — G9340 Encephalopathy, unspecified: Secondary | ICD-10-CM | POA: Diagnosis not present

## 2022-12-28 DIAGNOSIS — R569 Unspecified convulsions: Secondary | ICD-10-CM

## 2022-12-28 LAB — GLUCOSE, CAPILLARY
Glucose-Capillary: 144 mg/dL — ABNORMAL HIGH (ref 70–99)
Glucose-Capillary: 140 mg/dL — ABNORMAL HIGH (ref 70–99)
Glucose-Capillary: 108 mg/dL — ABNORMAL HIGH (ref 70–99)
Glucose-Capillary: 91 mg/dL (ref 70–99)
Glucose-Capillary: 103 mg/dL — ABNORMAL HIGH (ref 70–99)
Glucose-Capillary: 102 mg/dL — ABNORMAL HIGH (ref 70–99)

## 2022-12-28 LAB — BASIC METABOLIC PANEL
Anion gap: 15 (ref 5–15)
BUN: 7 mg/dL (ref 6–20)
CO2: 21 mmol/L — ABNORMAL LOW (ref 22–32)
Calcium: 8.7 mg/dL — ABNORMAL LOW (ref 8.9–10.3)
Chloride: 105 mmol/L (ref 98–111)
Creatinine, Ser: 0.86 mg/dL (ref 0.61–1.24)
GFR, Estimated: >60 mL/min (ref >60)
Glucose, Bld: 129 mg/dL — ABNORMAL HIGH (ref 70–99)
Potassium: 3.6 mmol/L (ref 3.5–5.1)
Sodium: 141 mmol/L (ref 135–145)

## 2022-12-28 LAB — COMPREHENSIVE METABOLIC PANEL
ALT: 23 U/L (ref 0–44)
AST: 22 U/L (ref 15–41)
Albumin: 3.3 g/dL — ABNORMAL LOW (ref 3.5–5.0)
Alkaline Phosphatase: 36 U/L — ABNORMAL LOW (ref 38–126)
Anion gap: 12 (ref 5–15)
BUN: 7 mg/dL (ref 6–20)
CO2: 23 mmol/L (ref 22–32)
Calcium: 8.7 mg/dL — ABNORMAL LOW (ref 8.9–10.3)
Chloride: 105 mmol/L (ref 98–111)
Creatinine, Ser: 0.96 mg/dL (ref 0.61–1.24)
GFR, Estimated: >60 mL/min (ref >60)
Glucose, Bld: 154 mg/dL — ABNORMAL HIGH (ref 70–99)
Potassium: 3.6 mmol/L (ref 3.5–5.1)
Sodium: 140 mmol/L (ref 135–145)
Total Bilirubin: 1.5 mg/dL — ABNORMAL HIGH (ref 0.3–1.2)
Total Protein: 5.9 g/dL — ABNORMAL LOW (ref 6.5–8.1)

## 2022-12-28 LAB — CBC WITH DIFFERENTIAL/PLATELET
Abs Immature Granulocytes: 0.05 10*3/uL (ref 0.00–0.07)
Basophils Absolute: 0 10*3/uL (ref 0.0–0.1)
Basophils Relative: 0 %
Eosinophils Absolute: 0 10*3/uL (ref 0.0–0.5)
Eosinophils Relative: 0 %
HCT: 46.1 % (ref 39.0–52.0)
Hemoglobin: 15.8 g/dL (ref 13.0–17.0)
Immature Granulocytes: 0 %
Lymphocytes Relative: 5 %
Lymphs Abs: 0.6 10*3/uL — ABNORMAL LOW (ref 0.7–4.0)
MCH: 28.8 pg (ref 26.0–34.0)
MCHC: 34.3 g/dL (ref 30.0–36.0)
MCV: 84 fL (ref 80.0–100.0)
Monocytes Absolute: 1 10*3/uL (ref 0.1–1.0)
Monocytes Relative: 9 %
Neutro Abs: 9.8 10*3/uL — ABNORMAL HIGH (ref 1.7–7.7)
Neutrophils Relative %: 86 %
Platelets: 214 10*3/uL (ref 150–400)
RBC: 5.49 MIL/uL (ref 4.22–5.81)
RDW: 12.5 % (ref 11.5–15.5)
WBC: 11.5 10*3/uL — ABNORMAL HIGH (ref 4.0–10.5)
nRBC: 0 % (ref 0.0–0.2)

## 2022-12-28 LAB — POTASSIUM: Potassium: 3.3 mmol/L — ABNORMAL LOW (ref 3.5–5.1)

## 2022-12-28 LAB — POCT I-STAT 7, (LYTES, BLD GAS, ICA,H+H)
Acid-Base Excess: 1 mmol/L (ref 0.0–2.0)
Acid-Base Excess: 1 mmol/L (ref 0.0–2.0)
Bicarbonate: 23 mmol/L (ref 20.0–28.0)
Bicarbonate: 24.4 mmol/L (ref 20.0–28.0)
Calcium, Ion: 1.17 mmol/L (ref 1.15–1.40)
Calcium, Ion: 1.17 mmol/L (ref 1.15–1.40)
HCT: 48 % (ref 39.0–52.0)
HCT: 46 % (ref 39.0–52.0)
Hemoglobin: 16.3 g/dL (ref 13.0–17.0)
Hemoglobin: 15.6 g/dL (ref 13.0–17.0)
O2 Saturation: 99 %
O2 Saturation: 99 %
Patient temperature: 37.3
Patient temperature: 37
Potassium: 3.4 mmol/L — ABNORMAL LOW (ref 3.5–5.1)
Potassium: 3.6 mmol/L (ref 3.5–5.1)
Sodium: 142 mmol/L (ref 135–145)
Sodium: 142 mmol/L (ref 135–145)
TCO2: 24 mmol/L (ref 22–32)
TCO2: 25 mmol/L (ref 22–32)
pCO2 arterial: 29.8 mm[Hg] — ABNORMAL LOW (ref 32–48)
pCO2 arterial: 35.5 mm[Hg] (ref 32–48)
pH, Arterial: 7.497 — ABNORMAL HIGH (ref 7.35–7.45)
pH, Arterial: 7.444 (ref 7.35–7.45)
pO2, Arterial: 144 mm[Hg] — ABNORMAL HIGH (ref 83–108)
pO2, Arterial: 124 mm[Hg] — ABNORMAL HIGH (ref 83–108)

## 2022-12-28 LAB — TRIGLYCERIDES: Triglycerides: 46 mg/dL (ref <150)

## 2022-12-28 LAB — LACTIC ACID, PLASMA
Lactic Acid, Venous: 3.4 mmol/L (ref 0.5–1.9)
Lactic Acid, Venous: 4.2 mmol/L (ref 0.5–1.9)
Lactic Acid, Venous: 2.8 mmol/L (ref 0.5–1.9)

## 2022-12-28 LAB — MAGNESIUM: Magnesium: 2.2 mg/dL (ref 1.7–2.4)

## 2022-12-28 LAB — ACETAMINOPHEN LEVEL: Acetaminophen (Tylenol), Serum: <10 ug/mL — ABNORMAL LOW (ref 10–30)

## 2022-12-28 LAB — SALICYLATE LEVEL: Salicylate Lvl: <7 mg/dL — ABNORMAL LOW (ref 7.0–30.0)

## 2022-12-28 LAB — TROPONIN I (HIGH SENSITIVITY): Troponin I (High Sensitivity): 4 ng/L (ref <18)

## 2022-12-28 LAB — CK: Total CK: 36 U/L — ABNORMAL LOW (ref 49–397)

## 2022-12-28 LAB — PHOSPHORUS: Phosphorus: 1.4 mg/dL — ABNORMAL LOW (ref 2.5–4.6)

## 2022-12-28 LAB — MRSA NEXT GEN BY PCR, NASAL: MRSA by PCR Next Gen: NOT DETECTED

## 2022-12-28 MED ORDER — VITAL AF 1.2 CAL PO LIQD
1000.0000 mL | ORAL | Status: DC
Start: 2022-12-28 — End: 2022-12-30
  Administered 2022-12-28: 1000 mL

## 2022-12-28 MED ORDER — PROSOURCE TF20 ENFIT COMPATIBL EN LIQD: 60.0000 mL | Freq: Every day | ENTERAL | Status: DC

## 2022-12-28 MED ORDER — THIAMINE MONONITRATE 100 MG PO TABS
100.0000 mg | ORAL_TABLET | Freq: Every day | ORAL | Status: DC
  Administered 2022-12-28: 100 mg
  Filled 2022-12-28: qty 1

## 2022-12-28 MED ORDER — ENOXAPARIN SODIUM 40 MG/0.4ML IJ SOSY
40.0000 mg | PREFILLED_SYRINGE | Freq: Every day | INTRAMUSCULAR | Status: DC
  Administered 2022-12-28 – 2022-12-31 (×4): 40 mg via SUBCUTANEOUS
  Filled 2022-12-28 (×4): qty 0.4

## 2022-12-28 MED ORDER — PIVOT 1.5 CAL PO LIQD
1000.0000 mL | ORAL | Status: DC
  Filled 2022-12-28: qty 1000

## 2022-12-28 MED ORDER — ENOXAPARIN SODIUM 60 MG/0.6ML IJ SOSY: 60.0000 mg | PREFILLED_SYRINGE | Freq: Every day | INTRAMUSCULAR | Status: DC

## 2022-12-28 MED ORDER — MIDAZOLAM HCL 2 MG/2ML IJ SOLN
0.5000 mg | INTRAMUSCULAR | Status: DC | PRN
  Administered 2022-12-28 – 2022-12-29 (×4): 2 mg via INTRAVENOUS
  Filled 2022-12-28 (×4): qty 2

## 2022-12-28 MED ORDER — LACTATED RINGERS IV SOLN: INTRAVENOUS | Status: AC

## 2022-12-28 MED ORDER — ORAL CARE MOUTH RINSE: 15.0000 mL | Freq: Four times a day (QID) | OROMUCOSAL | Status: DC | PRN

## 2022-12-28 MED ORDER — POTASSIUM PHOSPHATES 15 MMOLE/5ML IV SOLN
30.0000 mmol | Freq: Once | INTRAVENOUS | Status: AC
  Administered 2022-12-28: 30 mmol via INTRAVENOUS
  Filled 2022-12-28: qty 10

## 2022-12-28 MED ORDER — POTASSIUM CHLORIDE 20 MEQ PO PACK
40.0000 meq | PACK | ORAL | Status: AC
  Administered 2022-12-28 (×2): 40 meq
  Filled 2022-12-28 (×2): qty 2

## 2022-12-28 NOTE — Progress Notes (Addendum)
This RN spoke with poison control at this time and reviewed case. Per poison control's suggestions:  OK to discontinue bicarb drip If QRS > 140, restart bicarb gtt at 1-27meq If pt hypotensive or seizure activity, and QRS > 120, initiate bicarb push Keep K+ and Mg at high normal  Will notify attending.  1415: Dossie Arbour, NP aware.

## 2022-12-28 NOTE — Progress Notes (Signed)
[facility] ADULT ICU REPLACEMENT PROTOCOL   The patient does apply for the [facility] Adult ICU Electrolyte Replacment Protocol based on the criteria listed below:   1.Exclusion criteria: TCTS, ECMO, Dialysis, and Myasthenia Gravis patients 2. Is GFR >/= 30 ml/min? Yes.    Patient's GFR today is >60 3. Is SCr </= 2? yes Patient's SCr is 0.96 mg/dL 4. Did SCr increase >/= 0.5 in 24 hours? No. 5.Pt's weight >40kg  Yes.   6. Abnormal electrolyte(s): Potassium, Phosphorus  7. Electrolytes replaced per protocol 8.  Call MD STAT for K+ </= 2.5, Phos </= 1, or Mag </= 1 Physician:  Dr. Namon Cirri A Leyanna Bittman 12/28/2022 4:26 AM

## 2022-12-28 NOTE — Progress Notes (Addendum)
Pt on suicide precautions. Pt intubated and sedated. 1:1 sitter in room and at bedside. Pt in bilateral soft wrist restraints per order. RASS score of -4. Will continue to monitor.

## 2022-12-28 NOTE — Plan of Care (Signed)
  Problem: Education: Goal: Ability to describe self-care measures that may prevent or decrease complications (Diabetes Survival Skills Education) will improve Outcome: Not Progressing Goal: Individualized Educational Video(s) Outcome: Not Progressing   Problem: Coping: Goal: Ability to adjust to condition or change in health will improve Outcome: Not Progressing   Problem: Fluid Volume: Goal: Ability to maintain a balanced intake and output will improve Outcome: Not Progressing   Problem: Health Behavior/Discharge Planning: Goal: Ability to identify and utilize available resources and services will improve Outcome: Not Progressing Goal: Ability to manage health-related needs will improve Outcome: Not Progressing   Problem: Metabolic: Goal: Ability to maintain appropriate glucose levels will improve Outcome: Not Progressing   Problem: Nutritional: Goal: Maintenance of adequate nutrition will improve Outcome: Not Progressing Goal: Progress toward achieving an optimal weight will improve Outcome: Not Progressing   Problem: Skin Integrity: Goal: Risk for impaired skin integrity will decrease Outcome: Not Progressing   Problem: Tissue Perfusion: Goal: Adequacy of tissue perfusion will improve Outcome: Not Progressing   Problem: Education: Goal: Knowledge of General Education information will improve Description: Including pain rating scale, medication(s)/side effects and non-pharmacologic comfort measures Outcome: Not Progressing   Problem: Health Behavior/Discharge Planning: Goal: Ability to manage health-related needs will improve Outcome: Not Progressing   Problem: Clinical Measurements: Goal: Ability to maintain clinical measurements within normal limits will improve Outcome: Not Progressing Goal: Will remain free from infection Outcome: Not Progressing Goal: Diagnostic test results will improve Outcome: Not Progressing Goal: Respiratory complications will  improve Outcome: Not Progressing Goal: Cardiovascular complication will be avoided Outcome: Not Progressing   Problem: Activity: Goal: Risk for activity intolerance will decrease Outcome: Not Progressing   Problem: Nutrition: Goal: Adequate nutrition will be maintained Outcome: Not Progressing   Problem: Coping: Goal: Level of anxiety will decrease Outcome: Not Progressing   Problem: Elimination: Goal: Will not experience complications related to bowel motility Outcome: Not Progressing Goal: Will not experience complications related to urinary retention Outcome: Not Progressing   Problem: Pain Management: Goal: General experience of comfort will improve Outcome: Not Progressing   Problem: Safety: Goal: Ability to remain free from injury will improve Outcome: Not Progressing   Problem: Skin Integrity: Goal: Risk for impaired skin integrity will decrease Outcome: Not Progressing   Problem: Safety: Goal: Non-violent Restraint(s) Outcome: Not Progressing   Problem: Activity: Goal: Ability to tolerate increased activity will improve Outcome: Not Progressing   Problem: Respiratory: Goal: Ability to maintain a clear airway and adequate ventilation will improve Outcome: Not Progressing   Problem: Role Relationship: Goal: Method of communication will improve Outcome: Not Progressing   Problem: Education: Goal: Ability to make informed decisions regarding treatment will improve Outcome: Not Progressing   Problem: Coping: Goal: Coping ability will improve Outcome: Not Progressing   Problem: Health Behavior/Discharge Planning: Goal: Identification of resources available to assist in meeting health care needs will improve Outcome: Not Progressing   Problem: Medication: Goal: Compliance with prescribed medication regimen will improve Outcome: Not Progressing   Problem: Self-Concept: Goal: Ability to disclose and discuss suicidal ideas will improve Outcome: Not  Progressing Goal: Will verbalize positive feelings about self Outcome: Not Progressing Note: Patient is initiating therapy. Patient will maintain adherence

## 2022-12-28 NOTE — Progress Notes (Signed)
Ok to DC as per Neuro

## 2022-12-28 NOTE — TOC Progression Note (Signed)
Transition of Care (TOC) - Progression Note    Patient Details  Name: Connard Thorn. MRN: [id number] Date of Birth: Oct 01, 1990  Transition of Care [facility]) CM/SW Contact  Donnalee Curry, LCSWA Phone Number: 12/28/2022, 3:22 PM  Clinical Narrative:     SW notified by bedside RN, pt's father at bedside and have questions regarding disability paperwork.  SW met with pt's father Evald Valasek. at bedside, reports he began the disability paperwork a couple weeks ago for pt and has completed the interview part. Ekam Sr reports he has now contacted a lawyer to assist and has paperwork that would need pt signature, but due to current status pt unable. SW encouraged Zildjian to reach out to lawyer to see if he is able to sign on pt's behalf as NOK. SW also requested bedside RN, reach out to MD, to see if they're able to complete a letter stating current condition and pt unable to consent.    Harland German, MSW, LCSWA

## 2022-12-28 NOTE — Plan of Care (Signed)
Received care of pt as a transfer for ER, after receiving a head CT. Pt ventilated, unresponsive to verbal stimuli, non purposeful movements noted. Bilateral wrist restraints in place with sitter at bedside. NaBicarb ,.0.9 Na, and Propofol infusing. C collar remains in place. Qtc monitoring initiated. OGT remains clamped. Bilateral breath sounds, clear and diminished. Pt's eyes are noted to go in opposite directions, during pupillary check. EEG completed. No s/s of distress at this time, will continue to monitor.

## 2022-12-28 NOTE — Progress Notes (Signed)
EEG complete - results pending 

## 2022-12-28 NOTE — Plan of Care (Signed)
  Problem: Health Behavior/Discharge Planning: Goal: Ability to manage health-related needs will improve Outcome: Progressing   Problem: Clinical Measurements: Goal: Ability to maintain clinical measurements within normal limits will improve Outcome: Progressing Goal: Will remain free from infection Outcome: Progressing Goal: Diagnostic test results will improve Outcome: Progressing Goal: Respiratory complications will improve Outcome: Progressing Goal: Cardiovascular complication will be avoided Outcome: Progressing   Problem: Activity: Goal: Risk for activity intolerance will decrease Outcome: Progressing   Problem: Nutrition: Goal: Adequate nutrition will be maintained Outcome: Progressing   Problem: Coping: Goal: Level of anxiety will decrease Outcome: Progressing   Problem: Elimination: Goal: Will not experience complications related to bowel motility Outcome: Progressing Goal: Will not experience complications related to urinary retention Outcome: Progressing   Problem: Pain Management: Goal: General experience of comfort will improve Outcome: Progressing   Problem: Safety: Goal: Ability to remain free from injury will improve Outcome: Progressing   Problem: Skin Integrity: Goal: Risk for impaired skin integrity will decrease Outcome: Progressing   Problem: Safety: Goal: Non-violent Restraint(s) Outcome: Progressing   Problem: Activity: Goal: Ability to tolerate increased activity will improve Outcome: Progressing   Problem: Respiratory: Goal: Ability to maintain a clear airway and adequate ventilation will improve Outcome: Progressing   Problem: Role Relationship: Goal: Method of communication will improve Outcome: Progressing   Problem: Education: Goal: Ability to make informed decisions regarding treatment will improve Outcome: Progressing   Problem: Coping: Goal: Coping ability will improve Outcome: Progressing   Problem: Health  Behavior/Discharge Planning: Goal: Identification of resources available to assist in meeting health care needs will improve Outcome: Progressing   Problem: Medication: Goal: Compliance with prescribed medication regimen will improve Outcome: Progressing

## 2022-12-28 NOTE — Progress Notes (Signed)
eLink Physician-Brief Progress Note Patient Name: Patrick Washington. DOB: [birth year] MRN: [id number]   Date of Service  12/28/2022  HPI/Events of Note  Patient admitted with altered mental status related to suspected drug overdose, he was intubated for airway protection.  eICU Interventions  New Patient Evaluation.        [name] 12/28/2022, 12:06 AM

## 2022-12-28 NOTE — Progress Notes (Signed)
eLink Physician-Brief Progress Note Patient Name: Patrick Washington. DOB: November 29, 1990 MRN: [id number]   Date of Service  12/28/2022  HPI/Events of Note  Patient extremely agitated despite high rate Propofol gtt.  eICU Interventions  PRN IV Versed added as a Propofol sparing measure.        Thomasene Lot Dalene Robards 12/28/2022, 4:22 AM

## 2022-12-28 NOTE — Progress Notes (Addendum)
Initial Nutrition Assessment  DOCUMENTATION CODES:   Not applicable  INTERVENTION:   Vital 1.2@75ml /hr- Initiate at 30ml/hr and increase by 38ml/hr q 8 hours until goal rate is reached.   Free water flushes 30ml q4 hours to maintain tube patency   Regimen provides 2160kcal/day, 135g/day protein and 1668ml/day of free water.   Pt at high refeed risk; recommend monitor potassium, magnesium and phosphorus labs daily until stable  Thiamine 100mg  daily via tube x 7 days  Daily weights   NUTRITION DIAGNOSIS:   Inadequate oral intake related to inability to eat (pt sedated and ventilated) as evidenced by NPO status.  GOAL:   Provide needs based on ASPEN/SCCM guidelines  MONITOR:   Vent status, Labs, Weight trends, TF tolerance, Skin, I & O's  REASON FOR ASSESSMENT:   Consult Enteral/tube feeding initiation and management  ASSESSMENT:   32 y/o male with h/p psychosis, agresive behavior, MDD and SI who is admitted with OD and possible aspiration.  RD working remotely.  Pt sedated and ventilated. OGT in place. Plan is to initiate tube feeds today. Pt is at high refeed risk. There is no recent documented weight history in chart to determine if any significant changes. RD will obtain NFPE at follow up.   Medications reviewed and include: lovenox, insulin, protonix, ceftriaxone, LRS @75ml /hr, Kphos, propofol   Labs reviewed: Na 142 wnl, K 3.6 wnl, P 1.4(L), Mg 2.2 wnl, tbili 1.5(H) Wbc- 11.5(H) Cbgs- 140, 144 x 24 hrs  AIC 4.9- 10/25  Patient is currently intubated on ventilator support MV: 9.8 L/min Temp (24hrs), Avg:98.2 F (36.8 C), Min:95.9 F (35.5 C), Max:99.3 F (37.4 C)  Propofol: 16.33 ml/hr- provides 431kcal/day   MAP- >9mmHg   UOP-   NUTRITION - FOCUSED PHYSICAL EXAM: Unable to perform at this time   Diet Order:   Diet Order             Diet NPO time specified  Diet effective now                  EDUCATION NEEDS:   No education  needs have been identified at this time  Skin:  Skin Assessment: Reviewed RN Assessment  Last BM:  PTA  Height:   Ht Readings from Last 1 Encounters:  12/27/22 5\' 10"  (1.778 m)    Weight:   Wt Readings from Last 1 Encounters:  12/28/22 90.7 kg    Ideal Body Weight:  75.45 kg  BMI:  Body mass index is 28.69 kg/m.  Estimated Nutritional Needs:   Kcal:  2129kcal/day  Protein:  120-135g/day  Fluid:  2.3-2.6L/day  Betsey Holiday MS, RD, LDN Please refer to [facility] for RD and/or RD on-call/weekend/after hours pager

## 2022-12-28 NOTE — Progress Notes (Addendum)
NAME:  Patrick Knipper., MRN:  [id number], DOB:  1991-02-24, LOS: 1 ADMISSION DATE:  12/27/2022, CONSULTATION DATE:  12/28/22 REFERRING MD:  Nila Nephew CHIEF COMPLAINT:  Acute Encephalopathy   History of Present Illness:  32 y.o. male brought in by EMS.  Patient was found on the scene unconscious.  A bottle of amitriptyline 100 mg was found on the scene that was empty.  This was reportedly filled in June 2024 with a total of 90 pills.  Initial heart rate was 160 with wide QRS.  Patient was intubated on the scene by EMS and was noted to have emesis in his airway.  EMS administered 2 amps Sodium Bicarb, NS bolus, & Fentanyl 50 mcg prior to arrival per documentation of ED triage nurse.  Patient's past medical history is unclear.  ED provider reports patient arrives obtunded with GCS of 3.  Patient's glucose was 172 on arrival.  He was normotensive and had normal saturation on 100% FiO2 on arrival.  With prolonged QTc and wide QRS after discussing the case with poison control and additional total of 200 mEq of sodium Bicarb was administered and patient was started on a Bicab drip at 50 cc/hr. this resulted in narrowing of his QRS but still with a prolonged QTc.  Activated charcoal was recommended by poison control but with inability to establish OG tube this had not been given at the time of consultation.  Initial workup revealed hypokalemia and hypocalcemia.  Significant Hospital Events: Including procedures, antibiotic start and stop dates in addition to other pertinent events   10/25 - Admit  Interim History / Subjective:  Sedated some w/d to pain in LE. Given violent psych hx keeping sedated for now.  Objective   Blood pressure 93/68, pulse 94, temperature 98.8 F (37.1 C), resp. rate 16, height 5\' 10"  (1.778 m), weight 90.7 kg, SpO2 96%.    Vent Mode: PRVC FiO2 (%):  [35 %-100 %] 35 % Set Rate:  [16 bmp-20 bmp] 16 bmp Vt Set:  [560 mL-580 mL] 580 mL PEEP:  [5 cmH20] 5 cmH20 Plateau  Pressure:  [19 cmH20-22 cmH20] 21 cmH20   Intake/Output Summary (Last 24 hours) at 12/28/2022 0932 Last data filed at 12/28/2022 0900 Gross per 24 hour  Intake 2112.12 ml  Output 1635 ml  Net 477.12 ml   Filed Weights   12/27/22 1912 12/28/22 0000  Weight: 90.7 kg 90.7 kg    Examination: Heavily sedated Pupils small/equal Slight w/d in all 4 ext Lungs rhonci, passive on vent Abd soft  Patient Lines/Drains/Airways Status     Active Line/Drains/Airways     Name Placement date Placement time Site Days   Peripheral IV 12/27/22 18 G Left Antecubital 12/27/22  1900  Antecubital  1   Peripheral IV 12/27/22 18 G Anterior;Distal;Right Forearm 12/27/22  1900  Forearm  1   Peripheral IV 12/27/22 18 G 1.16" Anterior;Distal;Right;Upper Arm 12/27/22  2123  Arm  1   Peripheral IV 12/28/22 20 G 1" Anterior;Left Forearm 12/28/22  0530  Forearm  less than 1   NG/OG Vented/Dual Lumen 16 Fr. Oral Marking at nare/corner of mouth 65 cm 12/27/22  2034  Oral  1   Urethral Catheter Tori Temperature probe;Triple-lumen 16 Fr. 12/27/22  1934  Temperature probe;Triple-lumen  1   Airway 8 mm 12/27/22  1853  -- 1           ABG ok Lactate clearing Mild leukocytosis with left shift BMP benign CT head/neck/chest reviewed EEG  neg Received activated charcoal  Resolved Hospital Problem list   N/A  Assessment & Plan:  Suspected TCA overdose Coma and aspiration s/p intubation Extensive psychiatric history- detailed in other chart Scarlett, Hyacinthe - [phone]   Hx of violent behavior toward staff  - Keep sedated for now - Bicarb gtt; rebolus PRN QRS widening; q6h EKG - Wean sedation tomorrow carefully and hopefully wean to extubate - Ceftriaxone x 5 days reasonable - Restraints PRN - Psych consult once off vent  Best Practice (right click and "Reselect all SmartList Selections" daily)   Diet/type: start TF DVT prophylaxis: lovenox GI prophylaxis: PPI Lines: N/A Foley:  Yes, and it is  still needed Code Status:  full code Last date of multidisciplinary goals of care discussion Jerilynn Som trying to figure out POA]  33 min cc time Myrla Halsted MD PCCM [facility] 9:32 AM 12/28/22   Please see Amion for pager details.  From 7A-7P if no response please call [phone] After hours, please call ELink [phone]

## 2022-12-29 DIAGNOSIS — G9340 Encephalopathy, unspecified: Secondary | ICD-10-CM | POA: Diagnosis not present

## 2022-12-29 DIAGNOSIS — T50902A Poisoning by unspecified drugs, medicaments and biological substances, intentional self-harm, initial encounter: Secondary | ICD-10-CM

## 2022-12-29 LAB — GLUCOSE, CAPILLARY
Glucose-Capillary: 107 mg/dL — ABNORMAL HIGH (ref 70–99)
Glucose-Capillary: 76 mg/dL (ref 70–99)
Glucose-Capillary: 102 mg/dL — ABNORMAL HIGH (ref 70–99)
Glucose-Capillary: 105 mg/dL — ABNORMAL HIGH (ref 70–99)
Glucose-Capillary: 91 mg/dL (ref 70–99)

## 2022-12-29 LAB — RENAL FUNCTION PANEL
Albumin: 2.6 g/dL — ABNORMAL LOW (ref 3.5–5.0)
Anion gap: 8 (ref 5–15)
BUN: 9 mg/dL (ref 6–20)
CO2: 22 mmol/L (ref 22–32)
Calcium: 8.1 mg/dL — ABNORMAL LOW (ref 8.9–10.3)
Chloride: 108 mmol/L (ref 98–111)
Creatinine, Ser: 1.02 mg/dL (ref 0.61–1.24)
GFR, Estimated: >60 mL/min (ref >60)
Glucose, Bld: 89 mg/dL (ref 70–99)
Phosphorus: 2.6 mg/dL (ref 2.5–4.6)
Potassium: 3.4 mmol/L — ABNORMAL LOW (ref 3.5–5.1)
Sodium: 138 mmol/L (ref 135–145)

## 2022-12-29 LAB — URINE CULTURE
Culture: NO GROWTH
Special Requests: NORMAL

## 2022-12-29 LAB — CBC
HCT: 36.8 % — ABNORMAL LOW (ref 39.0–52.0)
Hemoglobin: 12.5 g/dL — ABNORMAL LOW (ref 13.0–17.0)
MCH: 29 pg (ref 26.0–34.0)
MCHC: 34 g/dL (ref 30.0–36.0)
MCV: 85.4 fL (ref 80.0–100.0)
Platelets: 162 10*3/uL (ref 150–400)
RBC: 4.31 MIL/uL (ref 4.22–5.81)
RDW: 13 % (ref 11.5–15.5)
WBC: 7.1 10*3/uL (ref 4.0–10.5)
nRBC: 0 % (ref 0.0–0.2)

## 2022-12-29 LAB — MAGNESIUM: Magnesium: 2 mg/dL (ref 1.7–2.4)

## 2022-12-29 MED ORDER — POTASSIUM CHLORIDE 20 MEQ PO PACK
40.0000 meq | PACK | Freq: Once | ORAL | Status: AC
  Administered 2022-12-29: 40 meq
  Filled 2022-12-29: qty 2

## 2022-12-29 MED ORDER — HYDRALAZINE HCL 20 MG/ML IJ SOLN
10.0000 mg | Freq: Four times a day (QID) | INTRAMUSCULAR | Status: DC | PRN
  Administered 2022-12-30 (×2): 10 mg via INTRAVENOUS
  Filled 2022-12-29 (×2): qty 1

## 2022-12-29 MED ORDER — DOCUSATE SODIUM 100 MG PO CAPS: 100.0000 mg | ORAL_CAPSULE | Freq: Two times a day (BID) | ORAL | Status: DC | PRN

## 2022-12-29 MED ORDER — DEXMEDETOMIDINE HCL IN NACL 400 MCG/100ML IV SOLN
0.0000 ug/kg/h | INTRAVENOUS | Status: DC
Start: 2022-12-29 — End: 2022-12-30
  Administered 2022-12-29: 0.8 ug/kg/h via INTRAVENOUS
  Administered 2022-12-29: 0.4 ug/kg/h via INTRAVENOUS
  Administered 2022-12-29 (×2): 0.8 ug/kg/h via INTRAVENOUS
  Administered 2022-12-30: 0.7 ug/kg/h via INTRAVENOUS
  Filled 2022-12-29 (×5): qty 100

## 2022-12-29 MED ORDER — THIAMINE MONONITRATE 100 MG PO TABS
100.0000 mg | ORAL_TABLET | Freq: Every day | ORAL | Status: DC
  Administered 2022-12-30 – 2022-12-31 (×2): 100 mg via ORAL
  Filled 2022-12-29 (×2): qty 1

## 2022-12-29 MED ORDER — ZIPRASIDONE MESYLATE 20 MG IM SOLR
20.0000 mg | Freq: Four times a day (QID) | INTRAMUSCULAR | Status: DC | PRN
  Administered 2022-12-29: 20 mg via INTRAMUSCULAR
  Filled 2022-12-29 (×2): qty 20

## 2022-12-29 MED ORDER — PHENOL 1.4 % MT LIQD
1.0000 | OROMUCOSAL | Status: DC | PRN
  Administered 2022-12-29 – 2022-12-30 (×4): 1 via OROMUCOSAL
  Filled 2022-12-29: qty 177

## 2022-12-29 MED ORDER — STERILE WATER FOR INJECTION IJ SOLN
INTRAMUSCULAR | Status: AC
  Administered 2022-12-29: 1.2 mL
  Filled 2022-12-29: qty 10

## 2022-12-29 NOTE — Progress Notes (Signed)
eLink Physician-Brief Progress Note Patient Name: Patrick Washington. DOB: 1990/11/03 MRN: [id number]   Date of Service  12/29/2022  HPI/Events of Note  BP 146/104 (117) Patient says he has HTN but does not take any anti-hypertensives HR 72 with prolonged QT on admission  eICU Interventions  Ordered hydralazine 10 mg IV prn for SBP > 170     Intervention Category Intermediate Interventions: Hypertension - evaluation and management  Darl Pikes 12/29/2022, 9:37 PM

## 2022-12-29 NOTE — Procedures (Signed)
Extubation Procedure Note  Patient Details:   Name: Patrick Washington. DOB: [birth year] MRN: [id number]    Pt extubated w/o complication to 4 lpm [place]. Pt had + cuff leak; no stridor noted post extubation  Airway Documentation:    Vent end date: 12/29/22 Vent end time: 1108   Evaluation  O2 sats: stable throughout Complications: No apparent complications Patient did tolerate procedure well. Bilateral Breath Sounds: Clear   Yes  Leafy Ro 12/29/2022, 11:42 AM

## 2022-12-29 NOTE — Progress Notes (Signed)
NAME:  Patrick Myren., MRN:  [id number], DOB:  Jul 19, 1990, LOS: 2 ADMISSION DATE:  12/27/2022, CONSULTATION DATE:  12/29/22 REFERRING MD:  Nila Nephew CHIEF COMPLAINT:  Acute Encephalopathy   History of Present Illness:  32 y.o. male brought in by EMS.  Patient was found on the scene unconscious.  A bottle of amitriptyline 100 mg was found on the scene that was empty.  This was reportedly filled in June 2024 with a total of 90 pills.  Initial heart rate was 160 with wide QRS.  Patient was intubated on the scene by EMS and was noted to have emesis in his airway.  EMS administered 2 amps Sodium Bicarb, NS bolus, & Fentanyl 50 mcg prior to arrival per documentation of ED triage nurse.  Patient's past medical history is unclear.  ED provider reports patient arrives obtunded with GCS of 3.  Patient's glucose was 172 on arrival.  He was normotensive and had normal saturation on 100% FiO2 on arrival.  With prolonged QTc and wide QRS after discussing the case with poison control and additional total of 200 mEq of sodium Bicarb was administered and patient was started on a Bicab drip at 50 cc/hr. this resulted in narrowing of his QRS but still with a prolonged QTc.  Activated charcoal was recommended by poison control but with inability to establish OG tube this had not been given at the time of consultation.  Initial workup revealed hypokalemia and hypocalcemia.  Significant Hospital Events: Including procedures, antibiotic start and stop dates in addition to other pertinent events   10/25 - Admit  Interim History / Subjective:  No events. Remains sedated.  Objective   Blood pressure 106/71, pulse 74, temperature (!) 97.5 F (36.4 C), temperature source Bladder, resp. rate 16, height 5\' 10"  (1.778 m), weight 92 kg, SpO2 94%.    Vent Mode: PRVC FiO2 (%):  [35 %] 35 % Set Rate:  [16 bmp] 16 bmp Vt Set:  [580 mL] 580 mL PEEP:  [5 cmH20] 5 cmH20 Plateau Pressure:  [18 cmH20-21 cmH20] 18 cmH20    Intake/Output Summary (Last 24 hours) at 12/29/2022 7829 Last data filed at 12/29/2022 0600 Gross per 24 hour  Intake 4907.03 ml  Output 1161 ml  Net 3746.03 ml   Filed Weights   12/27/22 1912 12/28/22 0000 12/29/22 0405  Weight: 90.7 kg 90.7 kg 92 kg    Examination: Sedated Passive on vent Lungs/heart/abd exam stable Foley in place straw urine  K being repleted CBC stable  Resolved Hospital Problem list   N/A  Assessment & Plan:  Suspected TCA overdose- improved, QRS now narrow Coma and aspiration s/p intubation- improved Extensive psychiatric history- detailed in other chart Kaspian, Vongph[place]anh - [phone]   Hx of violent behavior toward staff  - Wean to extubate with precedex and PRN geodon - Psych consult once awake and able to interact - Ceftriaxone x 5 days - Father hoping to get him eligible for disability due to inability to hold job  Best Practice (right click and "Control and instrumentation engineer" daily)   Diet/type: Off for potential extubation DVT prophylaxis: lovenox GI prophylaxis: PPI Lines: N/A Foley:  Yes, and it is still needed Code Status:  full code Last date of multidisciplinary goals of care discussion [father updated at length 10/26]  31 min cc time Myrla Halsted MD PCCM [facility] 7:12 AM 12/29/22   Please see Amion for pager details.  From 7A-7P if no  response please call [phone] After hours, please call ELink [phone]

## 2022-12-29 NOTE — Progress Notes (Signed)
[facility] ADULT ICU REPLACEMENT PROTOCOL   The patient does apply for the [facility] Adult ICU Electrolyte Replacment Protocol based on the criteria listed below:   1.Exclusion criteria: TCTS, ECMO, Dialysis, and Myasthenia Gravis patients 2. Is GFR >/= 30 ml/min? Yes.    Patient's GFR today is >60 3. Is SCr </= 2? Yes.   Patient's SCr is 1.02 mg/dL 4. Did SCr increase >/= 0.5 in 24 hours? No. 5.Pt's weight >40kg  Yes.   6. Abnormal electrolyte(s): K  7. Electrolytes replaced per protocol 8.  Call MD STAT for K+ </= 2.5, Phos </= 1, or Mag </= 1 Physician:  Flossie Buffy E Domino Holten 12/29/2022 6:00 AM

## 2022-12-29 NOTE — Consult Note (Signed)
[facility] Face-to-Face Psychiatry Consult   Reason for Consult:"Suicide attempt, long psych hx; eval for inpt admit" Referring Physician: Levon Hedger, MD Patient Identification: Patrick Washington. MRN:  [id number] Principal Diagnosis: Acute encephalopathy Diagnosis:  Principal Problem:   Acute encephalopathy Active Problems:   Tricyclic antidepressant overdose of undetermined intent   Total Time spent with patient: 1 hour  Subjective:  "I wanted to kill myself because I was looking for someone."  HPI: Patrick Washington. is a 32 y.o. male with unknown past psychiatric or medical history who was admitted on 12/27/22 after he was found unresponsive by a bystander following intentional overdose. It was reported that " a bottle of amitriptyline 100 mg was found on the scene that was empty. This was reportedly filled in June 2024 with a total of 90 pills." Psychiatry was consulted for evaluation of suicide attempt and need for inpatient psychiatric admission. Today, patient appears sedated and is only alert and oriented x 2. He reports that he wanted to kill himself because he was looking for someone. He could not fully participate in evaluation due to sedation and recent extubation. Patient is unable to contract for safety and remains a danger to himself at this time. He will benefit from psychiatric inpatient admission after medical stabilization.   Past Psychiatric History: unknown.   Risk to Self:  yes Risk to Others:  unknown  Prior Inpatient Therapy:  unable to assess Prior Outpatient Therapy:  unable to assess  Past Medical History:  Past Medical History:  Diagnosis Date   Acute psychosis (HCC)    Aggressive behavior    Bipolar affective disorder (HCC)    Convicted for criminal activity    Drug abuse (HCC)    Encephalopathy, hepatic (HCC) 2021   ETOH abuse    Hepatitis A    Homeless    Housing instability    Intermittent explosive disorder    Marijuana abuse    MDD (major depressive  disorder)    Nicotine addiction    OD (overdose of drug), intentional self-harm, initial encounter (HCC) 12/27/2022   Psychiatric inpatient    Suicidal ideations    Tobacco abuse    Unemployed     Past Surgical History:  Procedure Laterality Date   TONSILLECTOMY     Family History: History reviewed. No pertinent family history. Family Psychiatric  History: unknown  Social History:  Social History   Substance and Sexual Activity  Alcohol Use Yes     Social History   Substance and Sexual Activity  Drug Use Yes   Types: Marijuana    Social History   Socioeconomic History   Marital status: Single    Spouse name: Not on file   Number of children: Not on file   Years of education: Not on file   Highest education level: Not on file  Occupational History   Not on file  Tobacco Use   Smoking status: Every Day    Types: Cigarettes   Smokeless tobacco: Never  Substance and Sexual Activity   Alcohol use: Yes   Drug use: Yes    Types: Marijuana   Sexual activity: Not on file  Other Topics Concern   Not on file  Social History Narrative   Not on file   Social Determinants of Health   Financial Resource Strain: Not on file  Food Insecurity: Not on file  Transportation Needs: Not on file  Physical Activity: Not on file  Stress: Not on file  Social Connections: Not  on file   Additional Social History:    Allergies:  No Known Allergies  Labs:  Results for orders placed or performed during the hospital encounter of 12/27/22 (from the past 48 hour(s))  Comprehensive metabolic panel     Status: Abnormal   Collection Time: 12/27/22  6:56 PM  Result Value Ref Range   Sodium 141 135 - 145 mmol/L   Potassium 3.3 (L) 3.5 - 5.1 mmol/L   Chloride 106 98 - 111 mmol/L   CO2 25 22 - 32 mmol/L   Glucose, Bld 179 (H) 70 - 99 mg/dL    Comment: Glucose reference range applies only to samples taken after fasting for at least 8 hours.   BUN 7 6 - 20 mg/dL   Creatinine, Ser 1.30  0.61 - 1.24 mg/dL   Calcium 8.2 (L) 8.9 - 10.3 mg/dL   Total Protein 5.7 (L) 6.5 - 8.1 g/dL   Albumin 3.5 3.5 - 5.0 g/dL   AST 17 15 - 41 U/L   ALT 21 0 - 44 U/L   Alkaline Phosphatase 34 (L) 38 - 126 U/L   Total Bilirubin 0.7 0.3 - 1.2 mg/dL   GFR, Estimated >86 >57 mL/min    Comment: (NOTE) Calculated using the CKD-EPI Creatinine Equation (2021)    Anion gap 10 5 - 15    Comment: Performed at [facility] Lab, [address], [place], [place] [address]  Ethanol     Status: None   Collection Time: 12/27/22  6:56 PM  Result Value Ref Range   Alcohol, Ethyl (B) <10 <10 mg/dL    Comment: (NOTE) Lowest detectable limit for serum alcohol is 10 mg/dL.  For medical purposes only. Performed at [facility] Lab, [address], [place], [place] [address]   Salicylate level     Status: Abnormal   Collection Time: 12/27/22  6:56 PM  Result Value Ref Range   Salicylate Lvl <7.0 (L) 7.0 - 30.0 mg/dL    Comment: Performed at [facility] Lab, [address] [place], [place] [address]  Acetaminophen level     Status: Abnormal   Collection Time: 12/27/22  6:56 PM  Result Value Ref Range   Acetaminophen (Tylenol), Serum <10 (L) 10 - 30 ug/mL    Comment: (NOTE) Therapeutic concentrations vary significantly. A range of 10-30 ug/mL  may be an effective concentration for many patients. However, some  are best treated at concentrations outside of this range. Acetaminophen concentrations >150 ug/mL at 4 hours after ingestion  and >50 ug/mL at 12 hours after ingestion are often associated with  toxic reactions.  Performed at [facility] Lab, [address], [place], [place] [address]   cbc     Status: None   Collection Time: 12/27/22  6:56 PM  Result Value Ref Range   WBC 9.4 4.0 - 10.5 K/uL   RBC 5.28 4.22 - 5.81 MIL/uL   Hemoglobin 15.2 13.0 - 17.0 g/dL   HCT 72.5 36.6 - 44.0 %   MCV 85.4 80.0 - 100.0 fL   MCH 28.8 26.0 - 34.0 pg   MCHC 33.7 30.0 - 36.0 g/dL   RDW 34.7  42.5 - 95.6 %   Platelets 185 150 - 400 K/uL   nRBC 0.0 0.0 - 0.2 %    Comment: Performed at [facility] Lab, [address] [place], [place] [address]  Lithium level     Status: Abnormal   Collection Time: 12/27/22  6:56 PM  Result Value Ref Range   Lithium Lvl <0.06 (L) 0.60 - 1.20 mmol/L    Comment: Performed at [facility] Lab, [address], [place], [place] [address]  Magnesium     Status: Abnormal   Collection Time: 12/27/22  6:56 PM  Result Value Ref Range   Magnesium 1.6 (L) 1.7 - 2.4 mg/dL    Comment: Performed at [facility] Lab, [address], [place], [place] [address]  CK     Status: None   Collection Time: 12/27/22  6:56 PM  Result Value Ref Range   Total CK 54 49 - 397 U/L    Comment: Performed at [facility] Lab, [address] [place], [place] [address]  HIV Antibody (routine testing w rflx)     Status: None   Collection Time: 12/27/22  6:56 PM  Result Value Ref Range   HIV Screen 4th Generation wRfx Non Reactive Non Reactive    Comment: Performed at [facility] Lab, [address] [place], [place] [address]  Procalcitonin     Status: None   Collection Time: 12/27/22  6:56 PM  Result Value Ref Range   Procalcitonin <0.10 ng/mL    Comment:        Interpretation: PCT (Procalcitonin) <= 0.5 ng/mL: Systemic infection (sepsis) is not likely. Local bacterial infection is possible. (NOTE)       Sepsis PCT Algorithm           Lower Respiratory Tract                                      Infection PCT Algorithm    ----------------------------     ----------------------------         PCT < 0.25 ng/mL                PCT < 0.10 ng/mL          Strongly encourage             Strongly discourage   discontinuation of antibiotics    initiation of antibiotics    ----------------------------     -----------------------------       PCT 0.25 - 0.50 ng/mL            PCT 0.10 - 0.25 ng/mL               OR       >80% decrease in PCT            Discourage  initiation of                                            antibiotics      Encourage discontinuation           of antibiotics    ----------------------------     -----------------------------         PCT >= 0.50 ng/mL              PCT 0.26 - 0.50 ng/mL               AND        <80% decrease in PCT             Encourage initiation of  antibiotics       Encourage continuation           of antibiotics    ----------------------------     -----------------------------        PCT >= 0.50 ng/mL                  PCT > 0.50 ng/mL               AND         increase in PCT                  Strongly encourage                                      initiation of antibiotics    Strongly encourage escalation           of antibiotics                                     -----------------------------                                           PCT <= 0.25 ng/mL                                                 OR                                        > 80% decrease in PCT                                      Discontinue / Do not initiate                                             antibiotics  Performed at [facility] Lab, [address] [place], [place] [address]   Protime-INR     Status: None   Collection Time: 12/27/22  6:56 PM  Result Value Ref Range   Prothrombin Time 15.2 11.4 - 15.2 seconds   INR 1.2 0.8 - 1.2    Comment: (NOTE) INR goal varies based on device and disease states. Performed at [facility] Lab, [address], [place], [place] [address]   APTT     Status: None   Collection Time: 12/27/22  6:56 PM  Result Value Ref Range   aPTT 26 24 - 36 seconds    Comment: Performed at [facility], [address], [place], [place] [address]  Troponin I (High Sensitivity)     Status: None   Collection Time: 12/27/22  6:56 PM  Result Value Ref Range   Troponin I (High Sensitivity) 4 <18 ng/L    Comment: (NOTE) Elevated high  sensitivity troponin I (hsTnI) values  and significant  changes across serial measurements may suggest ACS but many other  chronic and acute conditions are known to elevate hsTnI results.  Refer to the "Links" section for chest pain algorithms and additional  guidance. Performed at [facility] Lab, [address] [place], [place] [address]   T4, free     Status: Abnormal   Collection Time: 12/27/22  6:56 PM  Result Value Ref Range   Free T4 1.64 (H) 0.61 - 1.12 ng/dL    Comment: (NOTE) Biotin ingestion may interfere with free T4 tests. If the results are inconsistent with the TSH level, previous test results, or the clinical presentation, then consider biotin interference. If needed, order repeat testing after stopping biotin. Performed at [facility] Lab, [address] [place], [place] [address]   ABO/Rh     Status: None   Collection Time: 12/27/22  6:56 PM  Result Value Ref Range   ABO/RH(D)      A POS Performed at [facility] Lab, [address], [place], [place] [address]   Vitamin B12     Status: Abnormal   Collection Time: 12/27/22  6:56 PM  Result Value Ref Range   Vitamin B-12 169 (L) 180 - 914 pg/mL    Comment: (NOTE) This assay is not validated for testing neonatal or myeloproliferative syndrome specimens for Vitamin B12 levels. Performed at [facility], [address], [place], [place] [address]   TSH     Status: None   Collection Time: 12/27/22  6:56 PM  Result Value Ref Range   TSH 1.072 0.350 - 4.500 uIU/mL    Comment: Performed by a 3rd Generation assay with a functional sensitivity of <=0.01 uIU/mL. Performed at [facility], [address], [place], [place] [address]   CBG monitoring, ED     Status: Abnormal   Collection Time: 12/27/22  7:03 PM  Result Value Ref Range   Glucose-Capillary 172 (H) 70 - 99 mg/dL    Comment: Glucose reference range applies only to samples taken after fasting for at least 8 hours.  I-Stat venous  blood gas, ([facility] ED, [facility], [facility])     Status: Abnormal   Collection Time: 12/27/22  7:05 PM  Result Value Ref Range   pH, Ven 7.452 (H) 7.25 - 7.43   pCO2, Ven 38.5 (L) 44 - 60 mmHg   pO2, Ven 95 (H) 32 - 45 mmHg   Bicarbonate 26.9 20.0 - 28.0 mmol/L   TCO2 28 22 - 32 mmol/L   O2 Saturation 98 %   Acid-Base Excess 3.0 (H) 0.0 - 2.0 mmol/L   Sodium 143 135 - 145 mmol/L   Potassium 3.3 (L) 3.5 - 5.1 mmol/L   Calcium, Ion 1.05 (L) 1.15 - 1.40 mmol/L   HCT 46.0 39.0 - 52.0 %   Hemoglobin 15.6 13.0 - 17.0 g/dL   Sample type VENOUS   I-stat chem 8, ED (not at [facility], [facility] or [facility])     Status: Abnormal   Collection Time: 12/27/22  7:12 PM  Result Value Ref Range   Sodium 148 (H) 135 - 145 mmol/L   Potassium 3.2 (L) 3.5 - 5.1 mmol/L   Chloride 98 98 - 111 mmol/L   BUN 6 6 - 20 mg/dL   Creatinine, Ser 7.25 0.61 - 1.24 mg/dL   Glucose, Bld 366 (H) 70 - 99 mg/dL    Comment: Glucose reference range applies only to samples taken after fasting for at  least 8 hours.   Calcium, Ion 0.98 (L) 1.15 - 1.40 mmol/L   TCO2 33 (H) 22 - 32 mmol/L   Hemoglobin 13.6 13.0 - 17.0 g/dL   HCT 95.6 21.3 - 08.6 %  Rapid urine drug screen (hospital performed)     Status: None   Collection Time: 12/27/22  7:57 PM  Result Value Ref Range   Opiates NONE DETECTED NONE DETECTED   Cocaine NONE DETECTED NONE DETECTED   Benzodiazepines NONE DETECTED NONE DETECTED   Amphetamines NONE DETECTED NONE DETECTED   Tetrahydrocannabinol NONE DETECTED NONE DETECTED   Barbiturates NONE DETECTED NONE DETECTED    Comment: (NOTE) DRUG SCREEN FOR MEDICAL PURPOSES ONLY.  IF CONFIRMATION IS NEEDED FOR ANY PURPOSE, NOTIFY LAB WITHIN 5 DAYS.  LOWEST DETECTABLE LIMITS FOR URINE DRUG SCREEN Drug Class                     Cutoff (ng/mL) Amphetamine and metabolites    1000 Barbiturate and metabolites    200 Benzodiazepine                 200 Opiates and metabolites        300 Cocaine and metabolites        300 THC                             50 Performed at [facility] Lab, [address], [place], [place] [address]   Urinalysis, Routine w reflex microscopic -Urine, Catheterized     Status: Abnormal   Collection Time: 12/27/22  7:57 PM  Result Value Ref Range   Color, Urine YELLOW YELLOW   APPearance HAZY (A) CLEAR   Specific Gravity, Urine 1.010 1.005 - 1.030   pH 9.0 (H) 5.0 - 8.0   Glucose, UA NEGATIVE NEGATIVE mg/dL   Hgb urine dipstick NEGATIVE NEGATIVE   Bilirubin Urine NEGATIVE NEGATIVE   Ketones, ur 5 (A) NEGATIVE mg/dL   Protein, ur 30 (A) NEGATIVE mg/dL   Nitrite NEGATIVE NEGATIVE   Leukocytes,Ua NEGATIVE NEGATIVE   RBC / HPF 0-5 0 - 5 RBC/hpf   WBC, UA 0-5 0 - 5 WBC/hpf   Bacteria, UA NONE SEEN NONE SEEN   Squamous Epithelial / HPF 0-5 0 - 5 /HPF   Mucus PRESENT    Hyaline Casts, UA PRESENT     Comment: Performed at [facility], [address] [place], [place] [address]  Urine Culture (for pregnant, neutropenic or urologic patients or patients with an indwelling urinary catheter)     Status: None   Collection Time: 12/27/22  7:57 PM   Specimen: Urine, Catheterized  Result Value Ref Range   Specimen Description URINE, CATHETERIZED    Special Requests Normal    Culture      NO GROWTH Performed at [facility] Lab, [address] [place] [address], [place], [place] [address]    Report Status 12/29/2022 FINAL   I-Stat arterial blood gas, ED     Status: Abnormal   Collection Time: 12/27/22  8:42 PM  Result Value Ref Range   pH, Arterial 7.520 (H) 7.35 - 7.45   pCO2 arterial 36.9 32 - 48 mmHg   pO2, Arterial 369 (H) 83 - 108 mmHg   Bicarbonate 30.2 (H) 20.0 - 28.0 mmol/L   TCO2 31 22 - 32 mmol/L   O2 Saturation 100 %   Acid-Base Excess 7.0 (H) 0.0 - 2.0 mmol/L  Sodium 144 135 - 145 mmol/L   Potassium 3.4 (L) 3.5 - 5.1 mmol/L   Calcium, Ion 1.08 (L) 1.15 - 1.40 mmol/L   HCT 48.0 39.0 - 52.0 %   Hemoglobin 16.3 13.0 - 17.0 g/dL   Collection site RADIAL, ALLEN'S TEST ACCEPTABLE    Drawn by Operator     Sample type ARTERIAL   SARS Coronavirus 2 by RT PCR (hospital order, performed in [facility] hospital lab) *cepheid single result test* Anterior Nasal Swab     Status: None   Collection Time: 12/27/22  8:46 PM   Specimen: Anterior Nasal Swab  Result Value Ref Range   SARS Coronavirus 2 by RT PCR NEGATIVE NEGATIVE    Comment: Performed at [facility] Lab, [address] [place], [place] [address]  CBG monitoring, ED     Status: Abnormal   Collection Time: 12/27/22  9:28 PM  Result Value Ref Range   Glucose-Capillary 139 (H) 70 - 99 mg/dL    Comment: Glucose reference range applies only to samples taken after fasting for at least 8 hours.  Cortisol     Status: None   Collection Time: 12/27/22  9:32 PM  Result Value Ref Range   Cortisol, Plasma 33.9 ug/dL    Comment: (NOTE) AM    6.7 - 22.6 ug/dL PM   <19.1       ug/dL Performed at [facility] Lab, [address] [place], [place] [address]   Type and screen If need to transfuse blood products please use the blood administration order set     Status: None   Collection Time: 12/27/22  9:32 PM  Result Value Ref Range   ABO/RH(D) A POS    Antibody Screen NEG    Sample Expiration      12/30/2022,2359 Performed at [facility], [address] [place], [place] [address]   Hemoglobin A1c     Status: None   Collection Time: 12/27/22  9:32 PM  Result Value Ref Range   Hgb A1c MFr Bld 4.9 4.8 - 5.6 %    Comment: (NOTE) Pre diabetes:          5.7%-6.4%  Diabetes:              >6.4%  Glycemic control for   <7.0% adults with diabetes    Mean Plasma Glucose 93.93 mg/dL    Comment: Performed at [facility] Lab, [address], [place], [place] [address]  Ammonia     Status: None   Collection Time: 12/27/22  9:32 PM  Result Value Ref Range   Ammonia 21 9 - 35 umol/L    Comment: Performed at [facility], [address] [place], [place] [address]  Lactic acid, plasma     Status: Abnormal   Collection Time:  12/27/22  9:50 PM  Result Value Ref Range   Lactic Acid, Venous 3.1 (HH) 0.5 - 1.9 mmol/L    Comment: CRITICAL RESULT CALLED TO, READ BACK BY AND VERIFIED WITH J. [facility] RN [phone] [facility] Performed at [facility], [address], [place], [place] [address]   Culture, blood (Routine X 2) w Reflex to ID Panel     Status: None (Preliminary result)   Collection Time: 12/27/22  9:50 PM   Specimen: BLOOD  Result Value Ref Range   Specimen Description BLOOD SITE NOT SPECIFIED    Special Requests      BOTTLES DRAWN AEROBIC ONLY  Blood Culture adequate volume   Culture      NO GROWTH 2 DAYS Performed at [facility], [address], [place], [place] [address]    Report Status PENDING   MRSA Next Gen by PCR, Nasal     Status: None   Collection Time: 12/27/22 11:42 PM   Specimen: Nasal Mucosa; Nasal Swab  Result Value Ref Range   MRSA by PCR Next Gen NOT DETECTED NOT DETECTED    Comment: (NOTE) The GeneXpert MRSA Assay (FDA approved for NASAL specimens only), is one component of a comprehensive MRSA colonization surveillance program. It is not intended to diagnose MRSA infection nor to guide or monitor treatment for MRSA infections. Test performance is not FDA approved in patients less than 77 years old. Performed at [facility] Lab, [address], [place], [place] [address]   Glucose, capillary     Status: Abnormal   Collection Time: 12/27/22 11:46 PM  Result Value Ref Range   Glucose-Capillary 125 (H) 70 - 99 mg/dL    Comment: Glucose reference range applies only to samples taken after fasting for at least 8 hours.  Culture, blood (Routine X 2) w Reflex to ID Panel     Status: None (Preliminary result)   Collection Time: 12/28/22  1:03 AM   Specimen: BLOOD  Result Value Ref Range   Specimen Description BLOOD SITE NOT SPECIFIED    Special Requests      BOTTLES DRAWN AEROBIC ONLY Blood Culture results may not be optimal due to an inadequate volume of blood  received in culture bottles   Culture      NO GROWTH 1 DAY Performed at [facility], [address], [place], [place] [address]    Report Status PENDING   CK     Status: Abnormal   Collection Time: 12/28/22  1:05 AM  Result Value Ref Range   Total CK 36 (L) 49 - 397 U/L    Comment: Performed at [facility] Lab, [address], [place], [place] [address]  Basic metabolic panel     Status: Abnormal   Collection Time: 12/28/22  1:05 AM  Result Value Ref Range   Sodium 141 135 - 145 mmol/L   Potassium 3.6 3.5 - 5.1 mmol/L   Chloride 105 98 - 111 mmol/L   CO2 21 (L) 22 - 32 mmol/L   Glucose, Bld 129 (H) 70 - 99 mg/dL    Comment: Glucose reference range applies only to samples taken after fasting for at least 8 hours.   BUN 7 6 - 20 mg/dL   Creatinine, Ser 0.10 0.61 - 1.24 mg/dL   Calcium 8.7 (L) 8.9 - 10.3 mg/dL   GFR, Estimated >27 >25 mL/min    Comment: (NOTE) Calculated using the CKD-EPI Creatinine Equation (2021)    Anion gap 15 5 - 15    Comment: Performed at [facility], [address], [place], [place] [address]  Magnesium     Status: None   Collection Time: 12/28/22  1:05 AM  Result Value Ref Range   Magnesium 2.2 1.7 - 2.4 mg/dL    Comment: Performed at [facility], [address] [place], [place] [address]  Troponin I (High Sensitivity)     Status: None   Collection Time: 12/28/22  1:05 AM  Result Value Ref Range   Troponin I (High Sensitivity) 4 <18 ng/L    Comment: (NOTE) Elevated high sensitivity troponin I (hsTnI)  values and significant  changes across serial measurements may suggest ACS but many other  chronic and acute conditions are known to elevate hsTnI results.  Refer to the "Links" section for chest pain algorithms and additional  guidance. Performed at [facility], [address], [place], [place] [address]   Lactic acid, plasma     Status: Abnormal   Collection Time: 12/28/22  1:05 AM  Result Value Ref Range   Lactic  Acid, Venous 3.4 (HH) 0.5 - 1.9 mmol/L    Comment: CRITICAL VALUE NOTED. VALUE IS CONSISTENT WITH PREVIOUSLY REPORTED/CALLED VALUE Performed at [facility] Lab, [address], [place], [place] [address]   I-STAT 7, (LYTES, BLD GAS, ICA, H+H)     Status: Abnormal   Collection Time: 12/28/22  1:19 AM  Result Value Ref Range   pH, Arterial 7.497 (H) 7.35 - 7.45   pCO2 arterial 29.8 (L) 32 - 48 mmHg   pO2, Arterial 144 (H) 83 - 108 mmHg   Bicarbonate 23.0 20.0 - 28.0 mmol/L   TCO2 24 22 - 32 mmol/L   O2 Saturation 99 %   Acid-Base Excess 1.0 0.0 - 2.0 mmol/L   Sodium 142 135 - 145 mmol/L   Potassium 3.4 (L) 3.5 - 5.1 mmol/L   Calcium, Ion 1.17 1.15 - 1.40 mmol/L   HCT 48.0 39.0 - 52.0 %   Hemoglobin 16.3 13.0 - 17.0 g/dL   Patient temperature 09.8 C    Collection site RADIAL, ALLEN'S TEST ACCEPTABLE    Drawn by RT    Sample type ARTERIAL   Phosphorus     Status: Abnormal   Collection Time: 12/28/22  2:32 AM  Result Value Ref Range   Phosphorus 1.4 (L) 2.5 - 4.6 mg/dL    Comment: Performed at [facility] Lab, [address] [place], [place] [address]  CBC with Differential/Platelet     Status: Abnormal   Collection Time: 12/28/22  2:32 AM  Result Value Ref Range   WBC 11.5 (H) 4.0 - 10.5 K/uL   RBC 5.49 4.22 - 5.81 MIL/uL   Hemoglobin 15.8 13.0 - 17.0 g/dL   HCT 78.2 95.6 - 21.3 %   MCV 84.0 80.0 - 100.0 fL   MCH 28.8 26.0 - 34.0 pg   MCHC 34.3 30.0 - 36.0 g/dL   RDW 08.6 57.8 - 46.9 %   Platelets 214 150 - 400 K/uL   nRBC 0.0 0.0 - 0.2 %   Neutrophils Relative % 86 %   Neutro Abs 9.8 (H) 1.7 - 7.7 K/uL   Lymphocytes Relative 5 %   Lymphs Abs 0.6 (L) 0.7 - 4.0 K/uL   Monocytes Relative 9 %   Monocytes Absolute 1.0 0.1 - 1.0 K/uL   Eosinophils Relative 0 %   Eosinophils Absolute 0.0 0.0 - 0.5 K/uL   Basophils Relative 0 %   Basophils Absolute 0.0 0.0 - 0.1 K/uL   Immature Granulocytes 0 %   Abs Immature Granulocytes 0.05 0.00 - 0.07 K/uL    Comment: Performed at  [facility], [address] [place], [place] [address]  Acetaminophen level     Status: Abnormal   Collection Time: 12/28/22  2:32 AM  Result Value Ref Range   Acetaminophen (Tylenol), Serum <10 (L) 10 - 30 ug/mL    Comment: (NOTE) Therapeutic concentrations vary significantly. A range of 10-30 ug/mL  may be an effective concentration for many patients. However, some  are best treated at concentrations  outside of this range. Acetaminophen concentrations >150 ug/mL at 4 hours after ingestion  and >50 ug/mL at 12 hours after ingestion are often associated with  toxic reactions.  Performed at [facility], [address], [place], [place] [address]   Salicylate level     Status: Abnormal   Collection Time: 12/28/22  2:32 AM  Result Value Ref Range   Salicylate Lvl <7.0 (L) 7.0 - 30.0 mg/dL    Comment: Performed at [facility], [address], [place], [place] [address]  Comprehensive metabolic panel     Status: Abnormal   Collection Time: 12/28/22  2:32 AM  Result Value Ref Range   Sodium 140 135 - 145 mmol/L   Potassium 3.6 3.5 - 5.1 mmol/L   Chloride 105 98 - 111 mmol/L   CO2 23 22 - 32 mmol/L   Glucose, Bld 154 (H) 70 - 99 mg/dL    Comment: Glucose reference range applies only to samples taken after fasting for at least 8 hours.   BUN 7 6 - 20 mg/dL   Creatinine, Ser 5.36 0.61 - 1.24 mg/dL   Calcium 8.7 (L) 8.9 - 10.3 mg/dL   Total Protein 5.9 (L) 6.5 - 8.1 g/dL   Albumin 3.3 (L) 3.5 - 5.0 g/dL   AST 22 15 - 41 U/L   ALT 23 0 - 44 U/L   Alkaline Phosphatase 36 (L) 38 - 126 U/L   Total Bilirubin 1.5 (H) 0.3 - 1.2 mg/dL   GFR, Estimated >64 >40 mL/min    Comment: (NOTE) Calculated using the CKD-EPI Creatinine Equation (2021)    Anion gap 12 5 - 15    Comment: Performed at [facility], [address] [place], [place] [address]  Triglycerides     Status: None   Collection Time: 12/28/22  2:33 AM  Result Value Ref Range   Triglycerides 46 <150  mg/dL    Comment: Performed at [facility], [address], [place], [place] [address]  Lactic acid, plasma     Status: Abnormal   Collection Time: 12/28/22  2:33 AM  Result Value Ref Range   Lactic Acid, Venous 4.2 (HH) 0.5 - 1.9 mmol/L    Comment: CRITICAL VALUE NOTED. VALUE IS CONSISTENT WITH PREVIOUSLY REPORTED/CALLED VALUE Performed at [facility], [address], [place], [place] [address]   Glucose, capillary     Status: Abnormal   Collection Time: 12/28/22  3:55 AM  Result Value Ref Range   Glucose-Capillary 144 (H) 70 - 99 mg/dL    Comment: Glucose reference range applies only to samples taken after fasting for at least 8 hours.  I-STAT 7, (LYTES, BLD GAS, ICA, H+H)     Status: Abnormal   Collection Time: 12/28/22  4:57 AM  Result Value Ref Range   pH, Arterial 7.444 7.35 - 7.45   pCO2 arterial 35.5 32 - 48 mmHg   pO2, Arterial 124 (H) 83 - 108 mmHg   Bicarbonate 24.4 20.0 - 28.0 mmol/L   TCO2 25 22 - 32 mmol/L   O2 Saturation 99 %   Acid-Base Excess 1.0 0.0 - 2.0 mmol/L   Sodium 142 135 - 145 mmol/L   Potassium 3.6 3.5 - 5.1 mmol/L   Calcium, Ion 1.17 1.15 - 1.40 mmol/L   HCT 46.0 39.0 - 52.0 %   Hemoglobin 15.6 13.0 - 17.0 g/dL   Patient temperature 33.2 C    Collection site RADIAL, ALLEN'S TEST  ACCEPTABLE    Drawn by RT    Sample type ARTERIAL   Glucose, capillary     Status: Abnormal   Collection Time: 12/28/22  7:09 AM  Result Value Ref Range   Glucose-Capillary 140 (H) 70 - 99 mg/dL    Comment: Glucose reference range applies only to samples taken after fasting for at least 8 hours.  Lactic acid, plasma     Status: Abnormal   Collection Time: 12/28/22  8:12 AM  Result Value Ref Range   Lactic Acid, Venous 2.8 (HH) 0.5 - 1.9 mmol/L    Comment: CRITICAL VALUE NOTED. VALUE IS CONSISTENT WITH PREVIOUSLY REPORTED/CALLED VALUE Performed at [facility], [address] [place], [place] [address]   Glucose, capillary     Status: Abnormal    Collection Time: 12/28/22 11:25 AM  Result Value Ref Range   Glucose-Capillary 108 (H) 70 - 99 mg/dL    Comment: Glucose reference range applies only to samples taken after fasting for at least 8 hours.  Glucose, capillary     Status: None   Collection Time: 12/28/22  3:05 PM  Result Value Ref Range   Glucose-Capillary 91 70 - 99 mg/dL    Comment: Glucose reference range applies only to samples taken after fasting for at least 8 hours.  Potassium     Status: Abnormal   Collection Time: 12/28/22  4:19 PM  Result Value Ref Range   Potassium 3.3 (L) 3.5 - 5.1 mmol/L    Comment: Performed at [facility], [address] [place], [place] [address]  Glucose, capillary     Status: Abnormal   Collection Time: 12/28/22  7:22 PM  Result Value Ref Range   Glucose-Capillary 103 (H) 70 - 99 mg/dL    Comment: Glucose reference range applies only to samples taken after fasting for at least 8 hours.  Glucose, capillary     Status: Abnormal   Collection Time: 12/28/22 11:26 PM  Result Value Ref Range   Glucose-Capillary 102 (H) 70 - 99 mg/dL    Comment: Glucose reference range applies only to samples taken after fasting for at least 8 hours.  Glucose, capillary     Status: Abnormal   Collection Time: 12/29/22  3:34 AM  Result Value Ref Range   Glucose-Capillary 107 (H) 70 - 99 mg/dL    Comment: Glucose reference range applies only to samples taken after fasting for at least 8 hours.  CBC     Status: Abnormal   Collection Time: 12/29/22  4:11 AM  Result Value Ref Range   WBC 7.1 4.0 - 10.5 K/uL   RBC 4.31 4.22 - 5.81 MIL/uL   Hemoglobin 12.5 (L) 13.0 - 17.0 g/dL   HCT 72.5 (L) 36.6 - 44.0 %   MCV 85.4 80.0 - 100.0 fL   MCH 29.0 26.0 - 34.0 pg   MCHC 34.0 30.0 - 36.0 g/dL   RDW 34.7 42.5 - 95.6 %   Platelets 162 150 - 400 K/uL   nRBC 0.0 0.0 - 0.2 %    Comment: Performed at [facility], [address] [place], [place] [address]  Renal function panel     Status: Abnormal    Collection Time: 12/29/22  4:11 AM  Result Value Ref Range   Sodium 138 135 - 145 mmol/L   Potassium 3.4 (L) 3.5 - 5.1 mmol/L   Chloride 108 98 - 111 mmol/L   CO2 22 22 - 32 mmol/L  Glucose, Bld 89 70 - 99 mg/dL    Comment: Glucose reference range applies only to samples taken after fasting for at least 8 hours.   BUN 9 6 - 20 mg/dL   Creatinine, Ser 2.70 0.61 - 1.24 mg/dL   Calcium 8.1 (L) 8.9 - 10.3 mg/dL   Phosphorus 2.6 2.5 - 4.6 mg/dL   Albumin 2.6 (L) 3.5 - 5.0 g/dL   GFR, Estimated >62 >37 mL/min    Comment: (NOTE) Calculated using the CKD-EPI Creatinine Equation (2021)    Anion gap 8 5 - 15    Comment: Performed at [facility], [address] [place], [place] [address]  Magnesium     Status: None   Collection Time: 12/29/22  4:11 AM  Result Value Ref Range   Magnesium 2.0 1.7 - 2.4 mg/dL    Comment: Performed at [facility], [address], [place], [place] [address]  Glucose, capillary     Status: None   Collection Time: 12/29/22  7:40 AM  Result Value Ref Range   Glucose-Capillary 76 70 - 99 mg/dL    Comment: Glucose reference range applies only to samples taken after fasting for at least 8 hours.  Glucose, capillary     Status: Abnormal   Collection Time: 12/29/22 11:33 AM  Result Value Ref Range   Glucose-Capillary 102 (H) 70 - 99 mg/dL    Comment: Glucose reference range applies only to samples taken after fasting for at least 8 hours.    Current Facility-Administered Medications  Medication Dose Route Frequency Provider Last Rate Last Admin   cefTRIAXone (ROCEPHIN) 2 g in sodium chloride 0.9 % 100 mL IVPB  2 g Intravenous Q24H Lorin Glass, MD   Stopped at 12/28/22 2126   Chlorhexidine Gluconate Cloth 2 % PADS 6 each  6 each Topical Daily Roslynn Amble, MD   6 each at 12/28/22 1600   dexmedetomidine (PRECEDEX) 400 MCG/100ML (4 mcg/mL) infusion  0-1.2 mcg/kg/hr Intravenous Titrated Lorin Glass, MD 18.4 mL/hr at 12/29/22 1300 0.8  mcg/kg/hr at 12/29/22 1300   docusate (COLACE) 50 MG/5ML liquid 100 mg  100 mg Per Tube BID PRN Roslynn Amble, MD       enoxaparin (LOVENOX) injection 40 mg  40 mg Subcutaneous Daily Doristine Counter, RPH   40 mg at 12/29/22 1030   feeding supplement (VITAL AF 1.2 CAL) liquid 1,000 mL  1,000 mL Per Tube Continuous Lorin Glass, MD   Stopped at 12/29/22 0929   insulin aspart (novoLOG) injection 0-9 Units  0-9 Units Subcutaneous Q4H Roslynn Amble, MD   1 Units at 12/28/22 0754   midazolam (VERSED) injection 0.5-2 mg  0.5-2 mg Intravenous Q2H PRN Migdalia Dk, MD   2 mg at 12/29/22 6073   Oral care mouth rinse  15 mL Mouth Rinse PRN Roslynn Amble, MD       Oral care mouth rinse  15 mL Mouth Rinse QID PRN Cheri Fowler, MD       polyethylene glycol (MIRALAX / GLYCOLAX) packet 17 g  17 g Per Tube Daily PRN Roslynn Amble, MD       thiamine (VITAMIN B1) tablet 100 mg  100 mg Per Tube Daily Lorin Glass, MD   100 mg at 12/28/22 1625   ziprasidone (GEODON) injection 20 mg  20 mg Intramuscular Q6H PRN Lorin Glass, MD   20 mg at 12/29/22 1035    Musculoskeletal: Strength &  Muscle Tone: within normal limits Gait & Station:  not assessed Patient leans: N/A  Psychiatric Specialty Exam:  Presentation  General Appearance:  Casual  Eye Contact: Poor  Speech: Slurred  Speech Volume: Decreased  Handedness: Right   Mood and Affect  Mood: Dysphoric  Affect: Constricted   Thought Process  Thought Processes: Disorganized  Descriptions of Associations:Loose  Orientation:Partial  Thought Content:-- (unable to assess)  History of Schizophrenia/Schizoaffective disorder:No data recorded Duration of Psychotic Symptoms:No data recorded Hallucinations:Hallucinations: -- (unable to assess)  Ideas of Reference:-- (unable to assess)  Suicidal Thoughts:Suicidal Thoughts: Yes, Active SI Active Intent and/or Plan: With Intent  Homicidal Thoughts:Homicidal  Thoughts: No   Sensorium  Memory: Recent Poor; Remote Poor  Judgment: Poor  Insight: Poor   Executive Functions  Concentration: Poor  Attention Span: Poor  Recall: Poor  Fund of Knowledge: Poor  Language: Fair   Psychomotor Activity  Psychomotor Activity: Psychomotor Activity: Decreased   Assets  Assets: Physical Health   Sleep  Sleep: Sleep: Fair   Physical Exam: Physical Exam ROS Blood pressure 118/76, pulse 86, temperature 99.7 F (37.6 C), temperature source Bladder, resp. rate (!) 22, height 5\' 10"  (1.778 m), weight 92 kg, SpO2 97%. Body mass index is 29.1 kg/m.  Treatment Plan Summary: Patient is 32 y/o male who was admitted on 12/27/22 after he was found unresponsive by a bystander following intentional overdose. Patient continues to endorse suicidal ideation and is unable to contract for safety. He will benefit from inpatient psychiatric admission upon medical stabilization.   Plan/Recommendation -Patient will benefit from 1:1 sitter for safety -No psychiatric medications for now -Continue medical treatment.  -Consider TOC/Social worker consult to facilitate transfer to inpatient psychiatry upon medical stabilization.   Disposition: Recommend psychiatric Inpatient admission when medically cleared. Supportive therapy provided about ongoing stressors. Psychiatric consult service will continue to follow this patient.   Fredonia Highland, MD 12/29/2022 3:52 PM

## 2022-12-29 NOTE — Progress Notes (Signed)
eLink Physician-Brief Progress Note Patient Name: Patrick Washington. DOB: 1990-07-05 MRN: [id number]   Date of Service  12/29/2022  HPI/Events of Note  Patient was just extubated earlier today and is complaining of throat pain. Seen awake and not in distress  eICU Interventions  Chloraseptic spray ordered prn     Intervention Category Minor Interventions: Routine modifications to care plan (e.g. PRN medications for pain, fever)  Rosalie Gums Tahira Olivarez 12/29/2022, 8:49 PM

## 2022-12-30 ENCOUNTER — Other Ambulatory Visit (HOSPITAL_COMMUNITY): Payer: Self-pay

## 2022-12-30 DIAGNOSIS — F333 Major depressive disorder, recurrent, severe with psychotic symptoms: Secondary | ICD-10-CM

## 2022-12-30 DIAGNOSIS — G9340 Encephalopathy, unspecified: Secondary | ICD-10-CM | POA: Diagnosis not present

## 2022-12-30 LAB — CBC
HCT: 41.7 % (ref 39.0–52.0)
Hemoglobin: 13.5 g/dL (ref 13.0–17.0)
MCH: 28.8 pg (ref 26.0–34.0)
MCHC: 32.4 g/dL (ref 30.0–36.0)
MCV: 89.1 fL (ref 80.0–100.0)
Platelets: 177 10*3/uL (ref 150–400)
RBC: 4.68 MIL/uL (ref 4.22–5.81)
RDW: 12.8 % (ref 11.5–15.5)
WBC: 6.8 10*3/uL (ref 4.0–10.5)
nRBC: 0 % (ref 0.0–0.2)

## 2022-12-30 LAB — RENAL FUNCTION PANEL
Albumin: 3.1 g/dL — ABNORMAL LOW (ref 3.5–5.0)
Anion gap: 10 (ref 5–15)
BUN: 6 mg/dL (ref 6–20)
CO2: 25 mmol/L (ref 22–32)
Calcium: 9.3 mg/dL (ref 8.9–10.3)
Chloride: 106 mmol/L (ref 98–111)
Creatinine, Ser: 0.96 mg/dL (ref 0.61–1.24)
GFR, Estimated: >60 mL/min (ref >60)
Glucose, Bld: 126 mg/dL — ABNORMAL HIGH (ref 70–99)
Phosphorus: 3.7 mg/dL (ref 2.5–4.6)
Potassium: 3.9 mmol/L (ref 3.5–5.1)
Sodium: 141 mmol/L (ref 135–145)

## 2022-12-30 LAB — GLUCOSE, CAPILLARY: Glucose-Capillary: 100 mg/dL — ABNORMAL HIGH (ref 70–99)

## 2022-12-30 LAB — MAGNESIUM: Magnesium: 2 mg/dL (ref 1.7–2.4)

## 2022-12-30 MED ORDER — ZIPRASIDONE MESYLATE 20 MG IM SOLR: 20.0000 mg | Freq: Every day | INTRAMUSCULAR | Status: DC

## 2022-12-30 MED ORDER — DOCUSATE SODIUM 100 MG PO CAPS: 100.0000 mg | ORAL_CAPSULE | Freq: Two times a day (BID) | ORAL | Status: DC | PRN

## 2022-12-30 MED ORDER — ZIPRASIDONE MESYLATE 20 MG IM SOLR: 20.0000 mg | Freq: Every day | INTRAMUSCULAR | Status: DC | PRN

## 2022-12-30 MED ORDER — HALOPERIDOL LACTATE 5 MG/ML IJ SOLN
5.0000 mg | Freq: Once | INTRAMUSCULAR | Status: AC | PRN
  Administered 2022-12-30: 5 mg via INTRAVENOUS
  Filled 2022-12-30: qty 1

## 2022-12-30 MED ORDER — VITAMIN B-1 100 MG PO TABS: 100.0000 mg | ORAL_TABLET | Freq: Every day | ORAL | Status: DC

## 2022-12-30 MED ORDER — AMLODIPINE BESYLATE 5 MG PO TABS
5.0000 mg | ORAL_TABLET | Freq: Every day | ORAL | Status: DC
  Administered 2022-12-30 – 2022-12-31 (×2): 5 mg via ORAL
  Filled 2022-12-30 (×2): qty 1

## 2022-12-30 MED ORDER — CEFDINIR 300 MG PO CAPS
600.0000 mg | ORAL_CAPSULE | Freq: Every day | ORAL | 0 refills | Status: DC
  Filled 2022-12-30: qty 4, 2d supply, fill #0

## 2022-12-30 MED ORDER — FLUOXETINE HCL 10 MG PO CAPS: 10.0000 mg | ORAL_CAPSULE | Freq: Every day | ORAL | Status: DC

## 2022-12-30 MED ORDER — FLUOXETINE HCL 10 MG PO CAPS
10.0000 mg | ORAL_CAPSULE | Freq: Every day | ORAL | Status: DC
  Administered 2022-12-30 – 2022-12-31 (×2): 10 mg via ORAL
  Filled 2022-12-30 (×2): qty 1

## 2022-12-30 MED ORDER — OLANZAPINE 5 MG PO TABS
5.0000 mg | ORAL_TABLET | Freq: Every day | ORAL | Status: DC
  Administered 2022-12-30: 5 mg via ORAL
  Filled 2022-12-30 (×3): qty 1

## 2022-12-30 MED ORDER — PHENOL 1.4 % MT LIQD: 1.0000 | OROMUCOSAL | Status: DC | PRN

## 2022-12-30 MED ORDER — OLANZAPINE 5 MG PO TABS: 5.0000 mg | ORAL_TABLET | Freq: Every day | ORAL | Status: DC

## 2022-12-30 MED ORDER — POLYETHYLENE GLYCOL 3350 17 G PO PACK: 17.0000 g | PACK | Freq: Every day | ORAL | Status: DC | PRN

## 2022-12-30 NOTE — Progress Notes (Signed)
eLink Physician-Brief Progress Note Patient Name: Patrick Washington. DOB: [birth year] MRN: [id number]   Date of Service  12/30/2022  HPI/Events of Note  Received request for renewal of restraints Patient seen on camera and a risk for self harm by pulling lines and tubes  eICU Interventions  Bilateral soft wrist restraints renewed Bedside team to assess in am if restraints to be continued     Intervention Category Intermediate Interventions: Other:  Darl Pikes 12/30/2022, 6:35 AM

## 2022-12-30 NOTE — TOC Initial Note (Addendum)
Transition of Care (TOC) - Initial/Assessment Note    Patient Details  Name: Patrick Washington. MRN: [id number] Date of Birth: 05/31/1990  Transition of Care [facility]) CM/SW Contact:    Erin Sons, LCSW Phone Number: 12/30/2022, 4:14 PM  Clinical Narrative:                  Pt is being IVC'd and has a bed at [facility] today. CSW submitted signed and notarized IVC paperwork to [employer]. Magistrate sent CSW signed custody order for IVC. 4 Copies were placed on chart and law enforcement has been request for service only of papers.   IVC Case Number: [id number] Envelope number: [id number]  Pt's BP currently too high for admission to [facility]. If BP resolves and pt DC's tonight, RN can call [phone] select option 3 and request transport of IVC patient. Pt would need to be transported to [facility]  @ [address] Ginette Otto.  TOC consult was placed for assistance with disability. CSW sent referral to [employer]. They will call to screen pt for disability. CSW included this info on AVS.   Expected Discharge Plan: Psychiatric Hospital Barriers to Discharge: Continued Medical Work up   Patient Goals and CMS Choice            Expected Discharge Plan and Services         Expected Discharge Date: 12/30/22                                    Prior Living Arrangements/Services                       Activities of Daily Living   ADL Screening (condition at time of admission) Independently performs ADLs?: Yes (appropriate for developmental age) Is the patient deaf or have difficulty hearing?: No Does the patient have difficulty seeing, even when wearing glasses/contacts?: No Does the patient have difficulty concentrating, remembering, or making decisions?: No  Permission Sought/Granted                  Emotional Assessment       Orientation: : Oriented to Self, Oriented to Place, Oriented to  Time, Oriented to  Situation   Psych Involvement: Yes (comment)  Admission diagnosis:  Acute encephalopathy [G93.40] TCA (tricyclic antidepressant) overdose of undetermined intent, initial encounter [T43.014A] Patient Active Problem List   Diagnosis Date Noted   Intentional overdose (HCC) 12/29/2022   Acute encephalopathy 12/27/2022   Tricyclic antidepressant overdose of undetermined intent 12/27/2022   PCP:  Patient, No Pcp Per Pharmacy:   [facility] [address] [place] [address] Phone: [phone] Fax: [phone]     Social Determinants of Health (SDOH) Social History: SDOH Screenings   Tobacco Use: High Risk (12/28/2022)   SDOH Interventions:     Readmission Risk Interventions     No data to display

## 2022-12-30 NOTE — Discharge Summary (Signed)
Physician Discharge Summary         Patient ID: Patrick Washington. MRN: [id number] DOB/AGE: Aug 14, 1990 32 y.o.  Admit date: 12/27/2022 Discharge date: 12/30/2022  Discharge Diagnoses:    Active Hospital Problems   Diagnosis Date Noted   Acute encephalopathy 12/27/2022   Intentional overdose (HCC) 12/29/2022   Tricyclic antidepressant overdose of undetermined intent 12/27/2022    Resolved Hospital Problems  No resolved problems to display.      Discharge summary   32 y.o. male brought in by EMS. Patient was found on the scene unconscious. A bottle of amitriptyline 100 mg was found on the scene that was empty. This was reportedly filled in June 2024 with a total of 90 pills. Initial heart rate was 160 with wide QRS. Patient was intubated on the scene by EMS and was noted to have emesis in his airway. EMS administered 2 amps Sodium Bicarb, NS bolus, & Fentanyl 50 mcg prior to arrival per documentation of ED triage nurse. Patient's past medical history is unclear. ED provider reports patient arrives obtunded with GCS of 3. Patient's glucose was 172 on arrival. He was normotensive and had normal saturation on 100% FiO2 on arrival. With prolonged QTc and wide QRS after discussing the case with poison control and additional total of 200 mEq of sodium Bicarb was administered and patient was started on a Bicab drip at 50 cc/hr. this resulted in narrowing of his QRS but still with a prolonged QTc.   Acute hypoxemic respiratory failure: Intubated by EMS in the field.  Vent settings weaned until eventually extubated on 10/27.  Patient weaned to room air on 10/28 on maintaining oxygen saturation.  Imaging concerning for aspiration pneumonia, received ceftriaxone x 3 days.  Transition to cefdinir x 2 additional days to complete course.  Intentional overdose tricyclic antidepressants: In discussion with poison control patient received sodium bicarb bolus and drip.  OG tube placed for administration  of activated charcoal.  Patient EKG initially with wide-complex, improved to narrow complex as toxicity results.  Electrolytes repleted as needed.  Encephalopathy: Likely toxic in the setting of overdose.  CT head and cervical spine negative for acute process.  EEG consistent with diffuse slowing.  Initially on propofol as seizure precaution but quickly weaned off.  Reported psychiatric illness: Noncompliant to medication outpatient.  Multiple prior suicide attempts.  Psychiatry consulted, started patient on Prozac and Zyprexa.  Patient IVC due to ongoing potential harm to self.  Discharged to inpatient psychiatric care for continued management.   Discharge Plan by Active Problems    Acute hypoxemic respiratory failure Due to aspiration pneumonia while encephalopathic from overdose.  Now resolved.  On room air. -Continue antibiotics, transitioned from CTX to Cefdinir x 5 day total course   Intentional overdose tricyclic antidepressants:  EKG is normalized.  Extubated 10/27. -Medically cleared for discharge   Toxic encephalopathy Due to overdose. Now improved. -Discontinue Precedex   Reported psychiatric illness Previously reportedly on Depakote and olanzapine and Cogentin. -Started on Prozac and Zyprexa per Psychiatry -IVC and transfer to [facility] for ongoing care -Geodon as needed   Significant Hospital tests/ studies  EEG adult Result Date: 12/28/2022 IMPRESSION: This study is suggestive of moderate diffuse encephalopathy. No seizures or epileptiform discharges were seen throughout the recording. Charlsie Quest   CT CHEST WO CONTRAST Result Date: 12/28/2022 IMPRESSION: 1. Extensive consolidation and centrilobular ground-glass pulmonary infiltrate within the lower lobes bilaterally in keeping with multifocal infection or aspiration. 2. Cholelithiasis.  CT HEAD  WO CONTRAST ( ) CT CERVICAL SPINE WO CONTRAST Result Date: 12/27/2022 IMPRESSION: 1. Negative non contrasted CT  appearance of the brain. 2. Negative CT appearance of the cervical spine. 3. Patchy mucosal thickening in the sinuses with small fluid level in the left maxillary sinus.   DG Abdomen 1 View Result Date: 12/27/2022 IMPRESSION: Esophageal tube tip looped back upon itself in the body of the stomach, the tip is directed to the patient's left.  DG Chest Portable 1 View Result Date: 12/27/2022 IMPRESSION: 1. Enteric tube tip in the distal esophagus and side port in the mid esophagus. Recommend advancement approximately 13 cm. 2. Endotracheal tube tip 5.8 cm from the carina.   Procedures   Extubated - 10/27  Culture data/antimicrobials    Antimicrobial: Ceftriaxone: 10/25-10/27  Results for orders placed or performed during the hospital encounter of 12/27/22  Urine Culture (for pregnant, neutropenic or urologic patients or patients with an indwelling urinary catheter)     Status: None   Collection Time: 12/27/22  7:57 PM   Specimen: Urine, Catheterized  Result Value Ref Range Status   Specimen Description URINE, CATHETERIZED  Final   Special Requests Normal  Final   Culture   Final    NO GROWTH Performed at [facility], [address], [place], [place] [address]    Report Status 12/29/2022 FINAL  Final  SARS Coronavirus 2 by RT PCR (hospital order, performed in [facility] hospital lab) *cepheid single result test* Anterior Nasal Swab     Status: None   Collection Time: 12/27/22  8:46 PM   Specimen: Anterior Nasal Swab  Result Value Ref Range Status   SARS Coronavirus 2 by RT PCR NEGATIVE NEGATIVE Final    Comment: Performed at [facility], [address], [place], [place] [address]  Culture, blood (Routine X 2) w Reflex to ID Panel     Status: None (Preliminary result)   Collection Time: 12/27/22  9:50 PM   Specimen: BLOOD  Result Value Ref Range Status   Specimen Description BLOOD SITE NOT SPECIFIED  Final   Special Requests   Final    BOTTLES DRAWN AEROBIC ONLY  Blood Culture adequate volume   Culture   Final    NO GROWTH 3 DAYS Performed at [facility], [address], [place], [place] [address]    Report Status PENDING  Incomplete  MRSA Next Gen by PCR, Nasal     Status: None   Collection Time: 12/27/22 11:42 PM   Specimen: Nasal Mucosa; Nasal Swab  Result Value Ref Range Status   MRSA by PCR Next Gen NOT DETECTED NOT DETECTED Final    Comment: (NOTE) The GeneXpert MRSA Assay (FDA approved for NASAL specimens only), is one component of a comprehensive MRSA colonization surveillance program. It is not intended to diagnose MRSA infection nor to guide or monitor treatment for MRSA infections. Test performance is not FDA approved in patients less than 18 years old. Performed at [facility], [address] [place], [place] [address]   Culture, blood (Routine X 2) w Reflex to ID Panel     Status: None (Preliminary result)   Collection Time: 12/28/22  1:03 AM   Specimen: BLOOD  Result Value Ref Range Status   Specimen Description BLOOD SITE NOT SPECIFIED  Final   Special Requests   Final    BOTTLES DRAWN AEROBIC ONLY Blood Culture results may not be optimal due to an inadequate volume of blood  received in culture bottles   Culture   Final    NO GROWTH 2 DAYS Performed at [facility], [address] [place], [place] [address]    Report Status PENDING  Incomplete      Consults  Psychiatry    Discharge Exam: BP (!) 171/118   Pulse 90   Temp 98.5 F (36.9 C) (Oral)   Resp 12   Ht 5\' 10"  (1.778 m)   Wt 99.1 kg   SpO2 94%   BMI 31.35 kg/m   General: Sitting up in bed, answers questions appropriately. NAD HENT: [place]/AT. Anicteric sclera Lungs: Mildly diminished at bases.  Normal work of breathing. Cardiovascular: RRR without murmur Abdomen: Soft, nondistended, nontender Extremities: No peripheral edema Neuro: Opens eyes to voice.  Follows commands appropriately.  Oriented x 3.  Endorses SI.  Labs at  discharge   Lab Results  Component Value Date   CREATININE 0.96 12/30/2022   BUN 6 12/30/2022   NA 141 12/30/2022   K 3.9 12/30/2022   CL 106 12/30/2022   CO2 25 12/30/2022   Lab Results  Component Value Date   WBC 6.8 12/30/2022   HGB 13.5 12/30/2022   HCT 41.7 12/30/2022   MCV 89.1 12/30/2022   PLT 177 12/30/2022   Lab Results  Component Value Date   ALT 23 12/28/2022   AST 22 12/28/2022   ALKPHOS 36 (L) 12/28/2022   BILITOT 1.5 (H) 12/28/2022   Lab Results  Component Value Date   INR 1.2 12/27/2022    Disposition:    Discharge disposition: 02-Transferred to [facility]       Allergies as of 12/30/2022       Reactions   Codeine Hives   Patient's father requested for this med to be added to allergies        Medication List     STOP taking these medications    benztropine 1 MG tablet Commonly known as: COGENTIN   divalproex 500 MG DR tablet Commonly known as: DEPAKOTE   MELATONIN PO       TAKE these medications    cefdinir 300 MG capsule Commonly known as: OMNICEF Take 2 capsules (600 mg total) by mouth daily for 2 days.   docusate sodium 100 MG capsule Commonly known as: COLACE Take 1 capsule (100 mg total) by mouth 2 (two) times daily as needed for mild constipation.   FLUoxetine 10 MG capsule Commonly known as: PROZAC Take 1 capsule (10 mg total) by mouth daily. Start taking on: December 31, 2022   OLANZapine 5 MG tablet Commonly known as: ZYPREXA Take 1 tablet (5 mg total) by mouth at bedtime. What changed:  medication strength how much to take when to take this   phenol 1.4 % Liqd Commonly known as: CHLORASEPTIC Use as directed 1 spray in the mouth or throat as needed for throat irritation / pain.   polyethylene glycol 17 g packet Commonly known as: MIRALAX / GLYCOLAX Place 17 g into feeding tube daily as needed for moderate constipation.   thiamine 100 MG tablet Commonly known as: Vitamin B-1 Take 1 tablet  (100 mg total) by mouth daily. Start taking on: December 31, 2022   ziprasidone 20 MG injection Commonly known as: GEODON Inject 20 mg into the muscle daily as needed for agitation.         Discharge Condition:    good    Signed: Elberta Fortis 12/30/2022, 3:30 PM

## 2022-12-30 NOTE — Progress Notes (Signed)
PCCM Brief Note  Received EPIC chat message regarding pt discharge to [facility]. Due to ongoing HTN, discharge was apparently cancelled as [facility] can not accept with current BP. He received PRN hydralazine with little effect. Also some mention of needing to be off IV meds.  I have added Norvasc 5mg  daily for now. Day team to revisit, may need further adjustment.  Hopefully bed at [facility] will still be available tomorrow and pt can d/c at that time.

## 2022-12-30 NOTE — Plan of Care (Signed)
  Problem: Clinical Measurements: Goal: Cardiovascular complication will be avoided Outcome: Progressing   Problem: Activity: Goal: Risk for activity intolerance will decrease Outcome: Progressing   Problem: Coping: Goal: Level of anxiety will decrease Outcome: Progressing   Problem: Respiratory: Goal: Ability to maintain a clear airway and adequate ventilation will improve Outcome: Progressing

## 2022-12-30 NOTE — Progress Notes (Addendum)
Spoke with patient's father, requested support with disability paperwork as previously discussed with Dr. Katrinka Blazing.  TOC consult placed.  Patient medically cleared for discharge to inpatient psychiatric care. [facility] accepted patient for transfer due to lack of inpatient beds available.  If behavioral health bed available tomorrow, PCCM team to complete discharge summary.  Update 1500: [facility] bed unexpectedly available, patient will transfer for inpatient psychiatric care today.
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
## 2022-12-31 ENCOUNTER — Encounter (HOSPITAL_COMMUNITY): Payer: Self-pay | Admitting: Psychiatry

## 2022-12-31 ENCOUNTER — Other Ambulatory Visit: Payer: Self-pay

## 2022-12-31 ENCOUNTER — Inpatient Hospital Stay (HOSPITAL_COMMUNITY)
Admission: AD | Admit: 2022-12-31 | Discharge: 2023-01-06 | DRG: 885 | Disposition: A | Discharge status: Home / Self Care | Payer: Medicaid Other | Source: Intra-hospital | Attending: Addiction Medicine | Admitting: Addiction Medicine

## 2022-12-31 DIAGNOSIS — G9340 Encephalopathy, unspecified: Secondary | ICD-10-CM | POA: Diagnosis not present

## 2022-12-31 DIAGNOSIS — F333 Major depressive disorder, recurrent, severe with psychotic symptoms: Principal | ICD-10-CM | POA: Diagnosis present

## 2022-12-31 DIAGNOSIS — F17210 Nicotine dependence, cigarettes, uncomplicated: Secondary | ICD-10-CM | POA: Diagnosis present

## 2022-12-31 DIAGNOSIS — F1210 Cannabis abuse, uncomplicated: Secondary | ICD-10-CM | POA: Diagnosis present

## 2022-12-31 DIAGNOSIS — F6381 Intermittent explosive disorder: Secondary | ICD-10-CM | POA: Diagnosis present

## 2022-12-31 DIAGNOSIS — F419 Anxiety disorder, unspecified: Secondary | ICD-10-CM | POA: Diagnosis present

## 2022-12-31 DIAGNOSIS — F319 Bipolar disorder, unspecified: Secondary | ICD-10-CM | POA: Diagnosis present

## 2022-12-31 DIAGNOSIS — Z5986 Financial insecurity: Secondary | ICD-10-CM

## 2022-12-31 DIAGNOSIS — Z91199 Patient's noncompliance with other medical treatment and regimen due to unspecified reason: Secondary | ICD-10-CM

## 2022-12-31 DIAGNOSIS — Z79899 Other long term (current) drug therapy: Secondary | ICD-10-CM

## 2022-12-31 DIAGNOSIS — T43012A Poisoning by tricyclic antidepressants, intentional self-harm, initial encounter: Principal | ICD-10-CM | POA: Diagnosis present

## 2022-12-31 DIAGNOSIS — Y929 Unspecified place or not applicable: Secondary | ICD-10-CM | POA: Diagnosis not present

## 2022-12-31 DIAGNOSIS — Z5901 Sheltered homelessness: Secondary | ICD-10-CM | POA: Diagnosis not present

## 2022-12-31 DIAGNOSIS — Z885 Allergy status to narcotic agent status: Secondary | ICD-10-CM | POA: Diagnosis not present

## 2022-12-31 DIAGNOSIS — Z560 Unemployment, unspecified: Secondary | ICD-10-CM

## 2022-12-31 HISTORY — DX: Unspecified intracranial injury with loss of consciousness status unknown, initial encounter: S06.9XAA

## 2022-12-31 LAB — MAGNESIUM: Magnesium: 1.7 mg/dL (ref 1.7–2.4)

## 2022-12-31 LAB — RENAL FUNCTION PANEL
Albumin: 3.5 g/dL (ref 3.5–5.0)
Anion gap: 11 (ref 5–15)
BUN: 9 mg/dL (ref 6–20)
CO2: 26 mmol/L (ref 22–32)
Calcium: 9.3 mg/dL (ref 8.9–10.3)
Chloride: 104 mmol/L (ref 98–111)
Creatinine, Ser: 0.96 mg/dL (ref 0.61–1.24)
GFR, Estimated: >60 mL/min (ref >60)
Glucose, Bld: 94 mg/dL (ref 70–99)
Phosphorus: 3.5 mg/dL (ref 2.5–4.6)
Potassium: 3.4 mmol/L — ABNORMAL LOW (ref 3.5–5.1)
Sodium: 141 mmol/L (ref 135–145)

## 2022-12-31 LAB — CBC
HCT: 45.4 % (ref 39.0–52.0)
Hemoglobin: 15.2 g/dL (ref 13.0–17.0)
MCH: 28.7 pg (ref 26.0–34.0)
MCHC: 33.5 g/dL (ref 30.0–36.0)
MCV: 85.8 fL (ref 80.0–100.0)
Platelets: 213 10*3/uL (ref 150–400)
RBC: 5.29 MIL/uL (ref 4.22–5.81)
RDW: 12.5 % (ref 11.5–15.5)
WBC: 7.4 10*3/uL (ref 4.0–10.5)
nRBC: 0 % (ref 0.0–0.2)

## 2022-12-31 MED ORDER — TRAZODONE HCL 50 MG PO TABS
50.0000 mg | ORAL_TABLET | Freq: Every evening | ORAL | Status: DC | PRN
  Administered 2022-12-31 – 2023-01-02 (×3): 50 mg via ORAL
  Filled 2022-12-31 (×4): qty 1

## 2022-12-31 MED ORDER — POLYETHYLENE GLYCOL 3350 17 G PO PACK: 17.0000 g | PACK | Freq: Every day | ORAL | Status: DC

## 2022-12-31 MED ORDER — ZIPRASIDONE MESYLATE 20 MG IM SOLR: 20.0000 mg | INTRAMUSCULAR | Status: DC | PRN

## 2022-12-31 MED ORDER — CLONIDINE HCL 0.2 MG PO TABS
0.2000 mg | ORAL_TABLET | Freq: Two times a day (BID) | ORAL | Status: DC
  Administered 2022-12-31: 0.2 mg via ORAL
  Filled 2022-12-31: qty 1

## 2022-12-31 MED ORDER — AMLODIPINE BESYLATE 10 MG PO TABS: 10.0000 mg | ORAL_TABLET | Freq: Every day | ORAL | Status: DC

## 2022-12-31 MED ORDER — HYDROCHLOROTHIAZIDE 12.5 MG PO TABS: 12.5000 mg | ORAL_TABLET | Freq: Every day | ORAL | Status: DC

## 2022-12-31 MED ORDER — ALUM & MAG HYDROXIDE-SIMETH 200-200-20 MG/5ML PO SUSP
30.0000 mL | ORAL | Status: DC | PRN
  Administered 2023-01-01: 30 mL via ORAL
  Filled 2022-12-31: qty 30

## 2022-12-31 MED ORDER — HYDROXYZINE HCL 25 MG PO TABS
25.0000 mg | ORAL_TABLET | Freq: Three times a day (TID) | ORAL | Status: DC | PRN
  Administered 2022-12-31 – 2023-01-02 (×5): 25 mg via ORAL
  Filled 2022-12-31 (×5): qty 1

## 2022-12-31 MED ORDER — ADULT MULTIVITAMIN W/MINERALS CH
1.0000 | ORAL_TABLET | Freq: Every day | ORAL | Status: DC
  Administered 2022-12-31: 1 via ORAL
  Filled 2022-12-31: qty 1

## 2022-12-31 MED ORDER — LORAZEPAM 1 MG PO TABS: 1.0000 mg | ORAL_TABLET | ORAL | Status: DC | PRN

## 2022-12-31 MED ORDER — AMLODIPINE BESYLATE 5 MG PO TABS
5.0000 mg | ORAL_TABLET | Freq: Every day | ORAL | Status: DC
  Administered 2023-01-01 – 2023-01-06 (×6): 5 mg via ORAL
  Filled 2022-12-31 (×8): qty 1

## 2022-12-31 MED ORDER — CLONIDINE HCL 0.2 MG PO TABS
0.2000 mg | ORAL_TABLET | Freq: Two times a day (BID) | ORAL | Status: DC
  Administered 2022-12-31 – 2023-01-05 (×10): 0.2 mg via ORAL
  Filled 2022-12-31 (×2): qty 1
  Filled 2022-12-31: qty 2
  Filled 2022-12-31 (×12): qty 1

## 2022-12-31 MED ORDER — POTASSIUM CHLORIDE CRYS ER 20 MEQ PO TBCR
40.0000 meq | EXTENDED_RELEASE_TABLET | Freq: Once | ORAL | Status: AC
  Administered 2022-12-31: 40 meq via ORAL
  Filled 2022-12-31: qty 2

## 2022-12-31 MED ORDER — OLANZAPINE 5 MG PO TBDP
5.0000 mg | ORAL_TABLET | Freq: Three times a day (TID) | ORAL | Status: DC | PRN
  Administered 2023-01-01: 5 mg via ORAL
  Filled 2022-12-31: qty 1

## 2022-12-31 MED ORDER — FLUOXETINE HCL 10 MG PO CAPS
10.0000 mg | ORAL_CAPSULE | Freq: Every day | ORAL | Status: DC
  Administered 2023-01-01 – 2023-01-02 (×2): 10 mg via ORAL
  Filled 2022-12-31 (×4): qty 1

## 2022-12-31 MED ORDER — HYDROCHLOROTHIAZIDE 12.5 MG PO TABS
12.5000 mg | ORAL_TABLET | Freq: Every day | ORAL | Status: DC
  Administered 2022-12-31: 12.5 mg via ORAL
  Filled 2022-12-31: qty 1

## 2022-12-31 MED ORDER — OLANZAPINE 5 MG PO TABS
5.0000 mg | ORAL_TABLET | Freq: Every day | ORAL | Status: DC
  Administered 2022-12-31: 5 mg via ORAL
  Filled 2022-12-31 (×4): qty 1

## 2022-12-31 MED ORDER — HYDROCHLOROTHIAZIDE 12.5 MG PO TABS
12.5000 mg | ORAL_TABLET | Freq: Every day | ORAL | Status: DC
  Administered 2023-01-01 – 2023-01-06 (×5): 12.5 mg via ORAL
  Filled 2022-12-31 (×7): qty 1

## 2022-12-31 MED ORDER — ACETAMINOPHEN 325 MG PO TABS: 650.0000 mg | ORAL_TABLET | Freq: Four times a day (QID) | ORAL | Status: DC | PRN

## 2022-12-31 MED ORDER — CLONIDINE HCL 0.2 MG PO TABS: 0.2000 mg | ORAL_TABLET | Freq: Two times a day (BID) | ORAL | Status: DC

## 2022-12-31 MED ORDER — MAGNESIUM SULFATE 2 GM/50ML IV SOLN
2.0000 g | Freq: Once | INTRAVENOUS | Status: AC
  Administered 2022-12-31: 2 g via INTRAVENOUS
  Filled 2022-12-31: qty 50

## 2022-12-31 MED ORDER — MAGNESIUM HYDROXIDE 400 MG/5ML PO SUSP: 30.0000 mL | Freq: Every day | ORAL | Status: DC | PRN

## 2022-12-31 MED ORDER — LEVOFLOXACIN 500 MG PO TABS
500.0000 mg | ORAL_TABLET | Freq: Every day | ORAL | Status: DC
  Administered 2022-12-31: 500 mg via ORAL
  Filled 2022-12-31: qty 1

## 2022-12-31 NOTE — Tx Team (Signed)
Initial Treatment Plan 12/31/2022 4:55 PM [name] ZOX:[id number]    PATIENT STRESSORS: Marital or family conflict   Medication change or noncompliance     PATIENT STRENGTHS: Printmaker for treatment/growth    PATIENT IDENTIFIED PROBLEMS:   Depression Anger issues                    DISCHARGE CRITERIA:  Ability to meet basic life and health needs Adequate post-discharge living arrangements Medical problems require only outpatient monitoring Motivation to continue treatment in a less acute level of care Safe-care adequate arrangements made Verbal commitment to aftercare and medication compliance Withdrawal symptoms are absent or subacute and managed without 24-hour nursing intervention  PRELIMINARY DISCHARGE PLAN: Attend aftercare/continuing care group Attend PHP/IOP Attend 12-step recovery group Outpatient therapy Participate in family therapy Placement in alternative living arrangements Return to previous work or school arrangements  PATIENT/FAMILY INVOLVEMENT: This treatment plan has been presented to and reviewed with the patient, Wakeem Bialecki., and/or family member, .  The patient and family have been given the opportunity to ask questions and make suggestions.  Malva Limes, RN 12/31/2022, 4:55 PM
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
## 2022-12-31 NOTE — Progress Notes (Signed)
Nutrition Follow-up  DOCUMENTATION CODES:   Not applicable  INTERVENTION:   - MVI with minerals daily  - Magic Cup TID with meals, each supplement provides 290 kcal and 9 grams of protein  NUTRITION DIAGNOSIS:   Inadequate oral intake related to inability to eat (pt sedated and ventilated) as evidenced by NPO status.  Progressing, diet advanced to Regular with thin liquids  GOAL:   Patient will meet greater than or equal to 90% of their needs  Progressing, being addressed via oral nutrition supplements  MONITOR:   PO intake, Supplement acceptance, Labs  REASON FOR ASSESSMENT:   Consult Enteral/tube feeding initiation and management  ASSESSMENT:   32 y/o male with h/p psychosis, agresive behavior, MDD and SI who is admitted with OD and possible aspiration.  10/27 - extubated, full liquid diet 10/28 - Regular diet  Discussed pt with RN and during ICU rounds. Pt awaiting d/c to [facility]. RN reports pt is consuming 100% of meals. Pt requiring feeding assistance due to ongoing need for restraints. Will order Magic Cup supplement with meals to aid pt in meeting kcal and protein needs. Will also order daily MVI with minerals.  Meal Completion: 100% x 4 documented meals  Medications reviewed and include: miralax, thiamine 100 mg, klor-con 40 mEq x 1, IV magnesium sulfate 2 grams x 1  Labs reviewed: potassium 3.4 CBG's: 76-105 x 24 hours  UOP: 2800 ml x 24 hours  Diet Order:   Diet Order             Diet regular Room service appropriate? Yes; Fluid consistency: Thin  Diet effective now                   EDUCATION NEEDS:   No education needs have been identified at this time  Skin:  Skin Assessment: Reviewed RN Assessment  Last BM:  12/29/22  Height:   Ht Readings from Last 1 Encounters:  12/27/22 5\' 10"  (1.778 m)    Weight:   Wt Readings from Last 1 Encounters:  12/31/22 96.5 kg    Ideal Body Weight:  75.45 kg  BMI:  Body mass index is 30.53  kg/m.  Estimated Nutritional Needs:   Kcal:  2300-2500  Protein:  115-130 grams  Fluid:  >2.2 L    Mertie Clause, MS, RD, LDN Registered Dietitian II Please see AMiON for contact information.

## 2022-12-31 NOTE — Group Note (Signed)
Date:  12/31/2022 Time:  9:47 PM  Group Topic/Focus:  Wrap-Up Group:   The focus of this group is to help patients review their daily goal of treatment and discuss progress on daily workbooks.    Participation Level:  Did Not Attend  Participation Quality:   N/A  Affect:   N/A  Cognitive:   N/A  Insight: None  Engagement in Group:   N/A  Modes of Intervention:   N/A  Additional Comments:  Patient did not attend wrap up group.   Kennieth Francois 12/31/2022, 9:47 PM

## 2022-12-31 NOTE — Discharge Summary (Signed)
Physician Discharge Summary         Patient ID: Patrick Washington. MRN: [id number] DOB/AGE: 32-30-1992 32 y.o.  Admit date: 12/27/2022 Discharge date: 12/31/2022  Discharge Diagnoses:    Active Hospital Problems   Diagnosis Date Noted   Acute encephalopathy 12/27/2022   Intentional overdose (HCC) 12/29/2022   Tricyclic antidepressant overdose of undetermined intent 12/27/2022    Resolved Hospital Problems  No resolved problems to display.      Discharge summary   32 y.o. male brought in by EMS. Patient was found on the scene unconscious. A bottle of amitriptyline 100 mg was found on the scene that was empty. This was reportedly filled in June 2024 with a total of 90 pills. Initial heart rate was 160 with wide QRS. Patient was intubated on the scene by EMS and was noted to have emesis in his airway. EMS administered 2 amps Sodium Bicarb, NS bolus, & Fentanyl 50 mcg prior to arrival per documentation of ED triage nurse. Patient's past medical history is unclear. ED provider reports patient arrives obtunded with GCS of 3. Patient's glucose was 172 on arrival. He was normotensive and had normal saturation on 100% FiO2 on arrival. With prolonged QTc and wide QRS after discussing the case with poison control and additional total of 200 mEq of sodium Bicarb was administered and patient was started on a Bicab drip at 50 cc/hr. this resulted in narrowing of his QRS but still with a prolonged QTc.   Acute hypoxemic respiratory failure: Intubated by EMS in the field.  Vent settings weaned until eventually extubated on 10/27.  Patient weaned to room air on 10/28 on maintaining oxygen saturation.  Imaging concerning for aspiration pneumonia, received ceftriaxone x 3 days.  Transition to cefdinir x 2 additional days to complete course.  HTN: Discharged on amlodipine 10 mg daily, hydrochlorothiazide 12.5 mg daily, Clonidine 0.2 mg BID.   Intentional overdose tricyclic antidepressants: In discussion  with poison control patient received sodium bicarb bolus and drip.  OG tube placed for administration of activated charcoal.  Patient EKG initially with wide-complex, improved to narrow complex as toxicity results.  Electrolytes repleted as needed.  Encephalopathy: Likely toxic in the setting of overdose.  CT head and cervical spine negative for acute process.  EEG consistent with diffuse slowing.  Initially on propofol as seizure precaution but quickly weaned off.  Reported psychiatric illness: Noncompliant to medication outpatient.  Multiple prior suicide attempts.  Psychiatry consulted, started patient on Prozac and Zyprexa.  Patient IVC due to ongoing potential harm to self.  Discharged to inpatient psychiatric care for continued management.   Discharge Plan by Active Problems    Acute hypoxemic respiratory failure Due to aspiration pneumonia while encephalopathic from overdose.  Now resolved.  On room air. -Continue antibiotics, transitioned from CTX to Cefdinir x 5 day total course   Intentional overdose tricyclic antidepressants:  EKG is normalized.  Extubated 10/27. -Medically cleared for discharge   Toxic encephalopathy Due to overdose. Now improved. -Discontinue Precedex   Reported psychiatric illness Previously reportedly on Depakote and olanzapine and Cogentin. -Started on Prozac and Zyprexa per Psychiatry -IVC and transfer to [facility] for ongoing care -Geodon as needed  HTN -meds as above, if BP softens would d/c clonidine first   Significant Hospital tests/ studies  EEG adult Result Date: 12/28/2022 IMPRESSION: This study is suggestive of moderate diffuse encephalopathy. No seizures or epileptiform discharges were seen throughout the recording. Priyanka [name]   CT CHEST WO CONTRAST  Result Date: 12/28/2022 IMPRESSION: 1. Extensive consolidation and centrilobular ground-glass pulmonary infiltrate within the lower lobes bilaterally in keeping with multifocal infection or  aspiration. 2. Cholelithiasis.  CT HEAD WO CONTRAST ( ) CT CERVICAL SPINE WO CONTRAST Result Date: 12/27/2022 IMPRESSION: 1. Negative non contrasted CT appearance of the brain. 2. Negative CT appearance of the cervical spine. 3. Patchy mucosal thickening in the sinuses with small fluid level in the left maxillary sinus.   DG Abdomen 1 View Result Date: 12/27/2022 IMPRESSION: Esophageal tube tip looped back upon itself in the body of the stomach, the tip is directed to the patient's left.  DG Chest Portable 1 View Result Date: 12/27/2022 IMPRESSION: 1. Enteric tube tip in the distal esophagus and side port in the mid esophagus. Recommend advancement approximately 13 cm. 2. Endotracheal tube tip 5.8 cm from the carina.   Procedures   Extubated - 10/27  Culture data/antimicrobials    Antimicrobial: Ceftriaxone: 10/25-10/27  Results for orders placed or performed during the hospital encounter of 12/27/22  Urine Culture (for pregnant, neutropenic or urologic patients or patients with an indwelling urinary catheter)     Status: None   Collection Time: 12/27/22  7:57 PM   Specimen: Urine, Catheterized  Result Value Ref Range Status   Specimen Description URINE, CATHETERIZED  Final   Special Requests Normal  Final   Culture   Final    NO GROWTH Performed at [facility], [address] [place], [place] [address]    Report Status 12/29/2022 FINAL  Final  SARS Coronavirus 2 by RT PCR (hospital order, performed in [facility] hospital lab) *cepheid single result test* Anterior Nasal Swab     Status: None   Collection Time: 12/27/22  8:46 PM   Specimen: Anterior Nasal Swab  Result Value Ref Range Status   SARS Coronavirus 2 by RT PCR NEGATIVE NEGATIVE Final    Comment: Performed at [facility], [address] [place], [place] [address]  Culture, blood (Routine X 2) w Reflex to ID Panel     Status: None (Preliminary result)   Collection Time: 12/27/22  9:50 PM    Specimen: BLOOD  Result Value Ref Range Status   Specimen Description BLOOD SITE NOT SPECIFIED  Final   Special Requests   Final    BOTTLES DRAWN AEROBIC ONLY Blood Culture adequate volume   Culture   Final    NO GROWTH 3 DAYS Performed at [facility], [address], [place], [place] [address]    Report Status PENDING  Incomplete  MRSA Next Gen by PCR, Nasal     Status: None   Collection Time: 12/27/22 11:42 PM   Specimen: Nasal Mucosa; Nasal Swab  Result Value Ref Range Status   MRSA by PCR Next Gen NOT DETECTED NOT DETECTED Final    Comment: (NOTE) The GeneXpert MRSA Assay (FDA approved for NASAL specimens only), is one component of a comprehensive MRSA colonization surveillance program. It is not intended to diagnose MRSA infection nor to guide or monitor treatment for MRSA infections. Test performance is not FDA approved in patients less than 58 years old. Performed at [facility], [address] [place], [place] [address]   Culture, blood (Routine X 2) w Reflex to ID Panel     Status: None (Preliminary result)   Collection Time: 12/28/22  1:03 AM   Specimen: BLOOD  Result Value Ref Range Status   Specimen Description BLOOD SITE NOT SPECIFIED  Final   Special Requests   Final    BOTTLES DRAWN AEROBIC ONLY Blood Culture results may not be optimal due to an inadequate volume of blood received in culture bottles   Culture   Final    NO GROWTH 2 DAYS Performed at [facility], [address], [place], [place] [address]    Report Status PENDING  Incomplete      Consults  Psychiatry    Discharge Exam: BP (!) 147/93   Pulse (!) 103   Temp 99.1 F (37.3 C) (Oral)   Resp (!) 25   Ht 5\' 10"  (1.778 m)   Wt 96.5 kg   SpO2 91%   BMI 30.53 kg/m   General: Sitting up in bed, answers questions appropriately. NAD HENT: [place]/AT. Anicteric sclera Lungs: Mildly diminished at bases.  Normal work of breathing. Cardiovascular: RRR without murmur Abdomen:  Soft, nondistended, nontender Extremities: No peripheral edema Neuro: Opens eyes to voice.  Follows commands appropriately.  Oriented x 3.  Endorses SI.  Labs at discharge   Lab Results  Component Value Date   CREATININE 0.96 12/31/2022   BUN 9 12/31/2022   NA 141 12/31/2022   K 3.4 (L) 12/31/2022   CL 104 12/31/2022   CO2 26 12/31/2022   Lab Results  Component Value Date   WBC 7.4 12/31/2022   HGB 15.2 12/31/2022   HCT 45.4 12/31/2022   MCV 85.8 12/31/2022   PLT 213 12/31/2022   Lab Results  Component Value Date   ALT 23 12/28/2022   AST 22 12/28/2022   ALKPHOS 36 (L) 12/28/2022   BILITOT 1.5 (H) 12/28/2022   Lab Results  Component Value Date   INR 1.2 12/27/2022    Disposition:    Discharge disposition: [facility] Not Defined        Allergies as of 12/31/2022       Reactions   Codeine Hives   Patient's father requested for this med to be added to allergies        Medication List     STOP taking these medications    benztropine 1 MG tablet Commonly known as: COGENTIN   divalproex 500 MG DR tablet Commonly known as: DEPAKOTE   MELATONIN PO       TAKE these medications    amLODipine 10 MG tablet Commonly known as: NORVASC Take 1 tablet (10 mg total) by mouth daily. Start taking on: January 01, 2023   cloNIDine 0.2 MG tablet Commonly known as: CATAPRES Take 1 tablet (0.2 mg total) by mouth 2 (two) times daily.   docusate sodium 100 MG capsule Commonly known as: COLACE Take 1 capsule (100 mg total) by mouth 2 (two) times daily as needed for mild constipation.   FLUoxetine 10 MG capsule Commonly known as: PROZAC Take 1 capsule (10 mg total) by mouth daily.   hydrochlorothiazide 12.5 MG tablet Commonly known as: HYDRODIURIL Take 1 tablet (12.5 mg total) by mouth daily.   OLANZapine 5 MG tablet Commonly known as: ZYPREXA Take 1 tablet (5 mg total) by mouth at bedtime. What changed:  medication  strength how much to take when to take this   phenol 1.4 % Liqd Commonly known as: CHLORASEPTIC Use as directed 1 spray in the mouth or throat as needed for throat irritation / pain.   polyethylene glycol 17 g packet Commonly known as: MIRALAX / GLYCOLAX Place 17 g into feeding tube daily as needed for moderate constipation.   thiamine  100 MG tablet Commonly known as: Vitamin B-1 Take 1 tablet (100 mg total) by mouth daily.   ziprasidone 20 MG injection Commonly known as: GEODON Inject 20 mg into the muscle daily as needed for agitation.          Discharge Condition:   fair    Signed: Lesia Sago Marlon Vonruden 12/31/2022, 1:27 PM

## 2022-12-31 NOTE — Progress Notes (Signed)
32 wm  Prior hepatitis A  choledocholithiasis in 2021 [[place]]--[facility]that hospital stay he required ICU level care with Precedex--was placed on Seroquel--no surgeries were done--he followed in [place] with Dr. Yevonne Pax GI and his labs norm He carries a h/o acute psychosis, MDD, Aggressive behavior--uses "delta 8" Prior incarcerated in jail  Looks like he has been admitted for suicidal attempt with multiple drugs and wide QRS and was intubated started on bicarb etc. etc. as per discharge summary that has been done  On exam at the bedside he is very calm cooperative and seems to be willing to follow recommendations and is taking meds without issue had breakfast this morning  EOMI NCAT no focal deficit Chest is clear Heart regular rhythm flat abdomen is soft no rebound  P Await [facility] placement at this time I have added clonidine twice daily to help control the blood pressure I have switched his ceftriaxone to Levaquin He is IVC and is on Prozac 10 as well as Zyprexa 5 currently with Geodon as needed for agitation but he has not shown any evidence is of that  We may be able to let restraints expire if he continues to be redirectable  From my perspective he is very stable and should be able to go to [facility] at any point in time   Pleas Koch, MD [facility] 10:16 AM

## 2022-12-31 NOTE — Progress Notes (Signed)
Patient ID: Patrick Ok., male   DOB: Jan 27, 1991, 32 y.o.   MRN: [id number] Pt given gatorade and water and encouraged po hydration.

## 2022-12-31 NOTE — Plan of Care (Signed)

## 2022-12-31 NOTE — Plan of Care (Signed)
  Problem: Nutrition: Goal: Adequate nutrition will be maintained Outcome: Progressing   Problem: Elimination: Goal: Will not experience complications related to bowel motility Outcome: Progressing   Problem: Clinical Measurements: Goal: Ability to maintain clinical measurements within normal limits will improve Outcome: Progressing  Problem: Safety: Goal: Ability to remain free from injury will improve Outcome: Progressing   Problem: Respiratory: Goal: Ability to maintain a clear airway and adequate ventilation will improve Outcome: Progressing

## 2022-12-31 NOTE — Progress Notes (Signed)
CSW contacted law enforcement for transport to [facility].

## 2022-12-31 NOTE — Progress Notes (Signed)
Report called to [facility] . Spoke with DR Rn. All questions answered. No pain dicomfort noted in pt at this time. Awaiiting transport.

## 2022-12-31 NOTE — Consult Note (Signed)
[facility] Face-to-Face Psychiatry Consult   Reason for Consult:"Suicide attempt, long psych hx; eval for inpt admit" Referring Physician: Levon Hedger, MD Patient Identification: Patrick Washington. MRN:  [id number] Principal Diagnosis: Acute encephalopathy Diagnosis:  Principal Problem:   Acute encephalopathy Active Problems:   Tricyclic antidepressant overdose of undetermined intent   Intentional overdose (HCC)   Total Time spent with patient: 1 hour  Subjective:  "I wanted to kill myself because I was looking for someone."  Initial Psychiatric Consult HPI 12/29/2022: Patrick Washington. is a 32 y.o. male with unknown past psychiatric or medical history who was admitted on 12/27/22 after he was found unresponsive by a bystander following intentional overdose. It was reported that " a bottle of amitriptyline 100 mg was found on the scene that was empty. This was reportedly filled in June 2024 with a total of 90 pills." Psychiatry was consulted for evaluation of suicide attempt and need for inpatient psychiatric admission. Today, patient appears sedated and is only alert and oriented x 2. He reports that he wanted to kill himself because he was looking for someone. He could not fully participate in evaluation due to sedation and recent extubation. Patient is unable to contract for safety and remains a danger to himself at this time. He will benefit from psychiatric inpatient admission after medical stabilization.   On Interview 12/31/2022 Patient has not had agitation event in the last 24 hours.  Patient seen laying in bed this morning on my approach accompanied by sitter at bedside. Patient reports that he slept Washington and is "Washington" today but reports that he wants to go back to his old job. Patient reports that losing this job was a trigger for his SA. Patient reports that he does not feel irritable or overly anxious. Patient denies active SI today, but endorses struggling with losing his job and not sure what  he will do next. Patient denies HI and AVH.   Past Psychiatric History: unknown.   Risk to Self:  yes Risk to Others:  unknown  Prior Inpatient Therapy:  unable to assess Prior Outpatient Therapy:  unable to assess  Past Medical History:  Past Medical History:  Diagnosis Date   Acute psychosis (HCC)    Aggressive behavior    Bipolar affective disorder (HCC)    Convicted for criminal activity    Drug abuse (HCC)    Encephalopathy, hepatic (HCC) 2021   ETOH abuse    Hepatitis A    Homeless    Housing instability    Intermittent explosive disorder    Marijuana abuse    MDD (major depressive disorder)    Nicotine addiction    OD (overdose of drug), intentional self-harm, initial encounter (HCC) 12/27/2022   Psychiatric inpatient    Suicidal ideations    Tobacco abuse    Unemployed     Past Surgical History:  Procedure Laterality Date   TONSILLECTOMY     Family History: History reviewed. No pertinent family history. Family Psychiatric  History: unknown  Social History:  Social History   Substance and Sexual Activity  Alcohol Use Yes     Social History   Substance and Sexual Activity  Drug Use Yes   Types: Marijuana    Social History   Socioeconomic History   Marital status: Single    Spouse name: Not on file   Number of children: Not on file   Years of education: Not on file   Highest education level: Not on file  Occupational  History   Not on file  Tobacco Use   Smoking status: Every Day    Types: Cigarettes   Smokeless tobacco: Never  Substance and Sexual Activity   Alcohol use: Yes   Drug use: Yes    Types: Marijuana   Sexual activity: Not on file  Other Topics Concern   Not on file  Social History Narrative   Not on file   Social Determinants of Health   Financial Resource Strain: Not on file  Food Insecurity: Not on file  Transportation Needs: Not on file  Physical Activity: Not on file  Stress: Not on file  Social Connections: Not on  file   Additional Social History:    Allergies:   Allergies  Allergen Reactions   Codeine Hives    Patient's father requested for this med to be added to allergies    Labs:  Results for orders placed or performed during the hospital encounter of 12/27/22 (from the past 48 hour(s))  Glucose, capillary     Status: Abnormal   Collection Time: 12/29/22  7:30 PM  Result Value Ref Range   Glucose-Capillary 105 (H) 70 - 99 mg/dL    Comment: Glucose reference range applies only to samples taken after fasting for at least 8 hours.  Glucose, capillary     Status: None   Collection Time: 12/29/22 11:24 PM  Result Value Ref Range   Glucose-Capillary 91 70 - 99 mg/dL    Comment: Glucose reference range applies only to samples taken after fasting for at least 8 hours.  Glucose, capillary     Status: Abnormal   Collection Time: 12/30/22  3:31 AM  Result Value Ref Range   Glucose-Capillary 100 (H) 70 - 99 mg/dL    Comment: Glucose reference range applies only to samples taken after fasting for at least 8 hours.  CBC     Status: None   Collection Time: 12/30/22  4:59 AM  Result Value Ref Range   WBC 6.8 4.0 - 10.5 K/uL   RBC 4.68 4.22 - 5.81 MIL/uL   Hemoglobin 13.5 13.0 - 17.0 g/dL   HCT 60.4 54.0 - 98.1 %   MCV 89.1 80.0 - 100.0 fL   MCH 28.8 26.0 - 34.0 pg   MCHC 32.4 30.0 - 36.0 g/dL   RDW 19.1 47.8 - 29.5 %   Platelets 177 150 - 400 K/uL   nRBC 0.0 0.0 - 0.2 %    Comment: Performed at [facility], [address] [place], [place] [address]  Renal function panel     Status: Abnormal   Collection Time: 12/30/22  4:59 AM  Result Value Ref Range   Sodium 141 135 - 145 mmol/L   Potassium 3.9 3.5 - 5.1 mmol/L   Chloride 106 98 - 111 mmol/L   CO2 25 22 - 32 mmol/L   Glucose, Bld 126 (H) 70 - 99 mg/dL    Comment: Glucose reference range applies only to samples taken after fasting for at least 8 hours.   BUN 6 6 - 20 mg/dL   Creatinine, Ser 8.65 0.61 - 1.24 mg/dL   Calcium 9.3  8.9 - 78.4 mg/dL   Phosphorus 3.7 2.5 - 4.6 mg/dL   Albumin 3.1 (L) 3.5 - 5.0 g/dL   GFR, Estimated >69 >62 mL/min    Comment: (NOTE) Calculated using the CKD-EPI Creatinine Equation (2021)    Anion gap 10 5 - 15    Comment: Performed at [facility],  [address] [place], [place] [address]  Magnesium     Status: None   Collection Time: 12/30/22  4:59 AM  Result Value Ref Range   Magnesium 2.0 1.7 - 2.4 mg/dL    Comment: Performed at [facility], [address] [place], [place] [address]  CBC     Status: None   Collection Time: 12/31/22  2:52 AM  Result Value Ref Range   WBC 7.4 4.0 - 10.5 K/uL   RBC 5.29 4.22 - 5.81 MIL/uL   Hemoglobin 15.2 13.0 - 17.0 g/dL   HCT 40.9 73.5 - 32.9 %   MCV 85.8 80.0 - 100.0 fL   MCH 28.7 26.0 - 34.0 pg   MCHC 33.5 30.0 - 36.0 g/dL   RDW 92.4 26.8 - 34.1 %   Platelets 213 150 - 400 K/uL   nRBC 0.0 0.0 - 0.2 %    Comment: Performed at [facility], [address], [place], [place] [address]  Renal function panel     Status: Abnormal   Collection Time: 12/31/22  2:52 AM  Result Value Ref Range   Sodium 141 135 - 145 mmol/L   Potassium 3.4 (L) 3.5 - 5.1 mmol/L   Chloride 104 98 - 111 mmol/L   CO2 26 22 - 32 mmol/L   Glucose, Bld 94 70 - 99 mg/dL    Comment: Glucose reference range applies only to samples taken after fasting for at least 8 hours.   BUN 9 6 - 20 mg/dL   Creatinine, Ser 9.79 0.61 - 1.24 mg/dL   Calcium 9.3 8.9 - 89.2 mg/dL   Phosphorus 3.5 2.5 - 4.6 mg/dL   Albumin 3.5 3.5 - 5.0 g/dL   GFR, Estimated >11 >94 mL/min    Comment: (NOTE) Calculated using the CKD-EPI Creatinine Equation (2021)    Anion gap 11 5 - 15    Comment: Performed at [facility], [address], [place], [place] [address]  Magnesium     Status: None   Collection Time: 12/31/22  2:52 AM  Result Value Ref Range   Magnesium 1.7 1.7 - 2.4 mg/dL    Comment: Performed at [facility], [address], [place], [place]  [address]    Current Facility-Administered Medications  Medication Dose Route Frequency Provider Last Rate Last Admin   [START ON 01/01/2023] amLODipine (NORVASC) tablet 10 mg  10 mg Oral Daily Rhetta Mura, MD       Chlorhexidine Gluconate Cloth 2 % PADS 6 each  6 each Topical Daily Roslynn Amble, MD   6 each at 12/30/22 1442   cloNIDine (CATAPRES) tablet 0.2 mg  0.2 mg Oral BID Rhetta Mura, MD   0.2 mg at 12/31/22 8563   docusate sodium (COLACE) capsule 100 mg  100 mg Oral BID PRN Lorin Glass, MD       enoxaparin (LOVENOX) injection 40 mg  40 mg Subcutaneous Daily Doristine Counter, RPH   40 mg at 12/31/22 1497   FLUoxetine (PROZAC) capsule 10 mg  10 mg Oral Daily Elberta Fortis, MD   10 mg at 12/31/22 0263   hydrALAZINE (APRESOLINE) injection 10 mg  10 mg Intravenous Q6H PRN Jeannette Corpus T, MD   10 mg at 12/30/22 2058   hydrochlorothiazide (HYDRODIURIL) tablet 12.5 mg  12.5 mg Oral Daily Rhetta Mura, MD   12.5 mg at 12/31/22 1319   levofloxacin (LEVAQUIN) tablet 500 mg  500 mg Oral Daily Samtani,  Cayden Rautio-Gurmukh, MD   500 mg at 12/31/22 4098   multivitamin with minerals tablet 1 tablet  1 tablet Oral Daily Rhetta Mura, MD   1 tablet at 12/31/22 1319   OLANZapine (ZYPREXA) tablet 5 mg  5 mg Oral QHS Elberta Fortis, MD   5 mg at 12/30/22 2101   Oral care mouth rinse  15 mL Mouth Rinse PRN Roslynn Amble, MD       Oral care mouth rinse  15 mL Mouth Rinse QID PRN Cheri Fowler, MD       phenol (CHLORASEPTIC) mouth spray 1 spray  1 spray Mouth/Throat PRN Darl Pikes, MD   1 spray at 12/30/22 0542   polyethylene glycol (MIRALAX / GLYCOLAX) packet 17 g  17 g Per Tube Daily PRN Roslynn Amble, MD       polyethylene glycol (MIRALAX / GLYCOLAX) packet 17 g  17 g Oral Daily Rhetta Mura, MD       thiamine (VITAMIN B1) tablet 100 mg  100 mg Oral Daily Lorin Glass, MD   100 mg at 12/31/22 1191   ziprasidone (GEODON) injection 20 mg  20 mg  Intramuscular Daily PRN Hunsucker, Lesia Sago, MD        Musculoskeletal: Strength & Muscle Tone: within normal limits Gait & Station:  not assessed Patient leans: N/A  Psychiatric Specialty Exam:  Presentation  General Appearance:  Appropriate for Environment  Eye Contact: Minimal  Speech: Clear and Coherent  Speech Volume: Normal  Handedness: Right   Mood and Affect  Mood: Dysphoric  Affect: Congruent   Thought Process  Thought Processes: Goal Directed  Descriptions of Associations:Intact  Orientation:Full (Time, Place and Person)  Thought Content:-- (concrete)  History of Schizophrenia/Schizoaffective disorder:No data recorded Duration of Psychotic Symptoms:No data recorded Hallucinations:Hallucinations: None  Ideas of Reference:None  Suicidal Thoughts:Suicidal Thoughts: No  Homicidal Thoughts:Homicidal Thoughts: No   Sensorium  Memory: Immediate Good; Remote Good  Judgment: Impaired  Insight: Shallow   Executive Functions  Concentration: Fair  Attention Span: Fair  Recall: Fair  Fund of Knowledge: Fair  Language: Fair   Psychomotor Activity  Psychomotor Activity: Psychomotor Activity: Psychomotor Retardation    Assets  Assets: Desire for Improvement   Sleep  Sleep: Sleep: Fair   Physical Exam: Physical Exam HENT:     Head: Normocephalic and atraumatic.  Pulmonary:     Effort: Pulmonary effort is normal.  Neurological:     Mental Status: He is alert.    Review of Systems  Psychiatric/Behavioral:  Negative for hallucinations. The patient does not have insomnia.    Blood pressure (!) 157/95, pulse 94, temperature 99.1 F (37.3 C), temperature source Oral, resp. rate (!) 23, height 5\' 10"  (1.778 m), weight 96.5 kg, SpO2 93%. Body mass index is 30.53 kg/m.  Treatment Plan Summary: Patient is 32 y/o male who was admitted on 12/27/22 after he was found unresponsive by a bystander following intentional  overdose. Patient continues to endorse depressed mood and poor coping skills about reently losing job and would benefit from inpatient hospitalization.   Plan/Recommendation -Patient will benefit from 1:1 sitter for safety - D'c soft restraints. - Continue Prozac 10 mg PO daily -Continue Zyprexa 5 mg PO at bedtime  -Transfer to [facility] -Consider TOC/Social worker consult to facilitate transfer to inpatient psychiatry upon medical stabilization.   Disposition: Recommend psychiatric Inpatient admission when medically cleared. Supportive therapy provided about ongoing stressors. Psychiatric consult service will continue to follow this patient.    PGY-4  Bobbye Morton, MD 12/31/2022 1:55 PM

## 2022-12-31 NOTE — Progress Notes (Signed)
   12/31/22 2200  Psychosocial Assessment  Patient Complaints Anxiety;Depression;Insomnia  Eye Contact Brief  Facial Expression Anxious  Affect Sad;Flat  Speech Logical/coherent  Interaction Minimal;Cautious  Motor Activity Fidgety  Appearance/Hygiene In hospital gown  Behavior Characteristics Appropriate to situation  Mood Anxious;Depressed  Thought Process  Coherency WDL  Content WDL  Delusions None reported or observed  Perception WDL  Hallucination None reported or observed  Judgment Impaired  Confusion None  Danger to Self  Current suicidal ideation? Denies  Danger to Others  Danger to Others None reported or observed

## 2023-01-01 ENCOUNTER — Other Ambulatory Visit (HOSPITAL_COMMUNITY): Payer: Self-pay

## 2023-01-01 ENCOUNTER — Encounter (HOSPITAL_COMMUNITY): Payer: Self-pay

## 2023-01-01 DIAGNOSIS — F333 Major depressive disorder, recurrent, severe with psychotic symptoms: Secondary | ICD-10-CM | POA: Diagnosis not present

## 2023-01-01 LAB — GLUCOSE, CAPILLARY: Glucose-Capillary: 102 mg/dL — ABNORMAL HIGH (ref 70–99)

## 2023-01-01 LAB — CULTURE, BLOOD (ROUTINE X 2)
Culture: NO GROWTH
Special Requests: ADEQUATE

## 2023-01-01 MED ORDER — ARIPIPRAZOLE 5 MG PO TABS
5.0000 mg | ORAL_TABLET | Freq: Every day | ORAL | Status: DC
  Administered 2023-01-01 – 2023-01-06 (×6): 5 mg via ORAL
  Filled 2023-01-01 (×8): qty 1

## 2023-01-01 NOTE — Progress Notes (Signed)
Patient presents with agitation and anxiety. Patient pacing and running in his room. Patient reports nausea at this time. When asked why he was running in his room, patient stated "I walk and run around to get rid of my abdominal pain." Vital signs obtained, PRN Zyprexa 5 mg PO administered for agitation per MAR. Ginger ale offered and tolerated. Patient resting in room. Will continue to monitor.

## 2023-01-01 NOTE — Progress Notes (Signed)
Pt rates depression 0/10 and anxiety 0/10. Pt reports a good appetite, and no physical problems. Pt denies SI/HI/AVH and verbally contracts for safety. Provided support and encouragement. Pt safe on the unit. Q 15 minute safety checks continued.

## 2023-01-01 NOTE — Group Note (Signed)
Recreation Therapy Group Note   Group Topic:Other  Group Date: 01/01/2023 Start Time: 0920 End Time: 1012 Facilitators: Marillyn Goren-McCall, LRT,CTRS Location: [address] Dayroom   Group Topic: Self-Expression  Goal Area(s) Addresses:  Patient will successfully identify positive attributes about themselves.  Patient will identify healthy ways to express feelings. Patient will acknowledge benefit(s) of improved self-expression.   Activity: Quarry manager. Patients were given card stock paper and individual watercolor sets. Patients were to use the supplies given to create pictures express what they are feeling or thinking.  Education: Self-Expression Discharge Planning  Education Outcome: Acknowledges education/In group clarification offered/Needs additional education   Affect/Mood: N/A   Participation Level: Did not attend    Clinical Observations/Individualized Feedback:     Plan: Continue to engage patient in RT group sessions 2-3x/week.   Sohan Potvin-McCall, LRT,CTRS 01/01/2023 1:36 PM

## 2023-01-01 NOTE — Progress Notes (Signed)
Patient reports  "heartburn," PRN Mylanta 30 ML administered per MAR.

## 2023-01-01 NOTE — Progress Notes (Signed)
I assumed care for Patrick Washington at about 07:45. Initially resting in bed, in no apparent distress. Later seen after am med pass. Vital signs WNL,reports poor sleep overnight (4.5 hrs recorded),has been med compliant, denied any avh/hi/si at the time of my assessment, endorses depressed mood not any worse than his usual baseline, vital signs WNL. He is resting in bed at this time, being monitored as ordered.

## 2023-01-01 NOTE — Plan of Care (Signed)
  Problem: Education: Goal: Knowledge of [place] [facility] Education information/materials will improve Outcome: Progressing   

## 2023-01-01 NOTE — BHH Group Notes (Signed)
Spiritual care group facilitated by Chaplain Dyanne Carrel, [facility]  Group focused on topic of strength. Group members reflected on what thoughts and feelings emerge when they hear this topic. They then engaged in facilitated dialog around how strength is present in their lives. This dialog focused on representing what strength had been to them in their lives (images and patterns given) and what they saw as helpful in their life now (what they needed / wanted).  Activity drew on narrative framework.  Patient Progress: Watson attended group.  Though verbal participation was limited, he demonstrated engagement.

## 2023-01-01 NOTE — Progress Notes (Signed)
Patient resting in bed, alert and oriented x 3. Patient rated his anxiety 7/10 and depression 4/10. Patient described his general mood "sleepy," and stated his sleep is "Yes or No."  Patient  denies SI, HI, and AVH. PRN Trazodone for sleep and Hydroxyzine for anxiety administered per MAR. Emotional and physical support provided as needed. Patient verbally contract for safety. Patient kept safe, Q 15 minutes safety checks maintained. Will continue to monitor.

## 2023-01-01 NOTE — Progress Notes (Signed)
   01/01/23 0558  15 Minute Checks  Location Bedroom  Visual Appearance Calm  Behavior Sleeping  Sleep (Behavioral Health Patients Only)  Calculate sleep? (Click Yes once per 24 hr at 0600 safety check) Yes  Documented sleep last 24 hours 4.5
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
## 2023-01-01 NOTE — Plan of Care (Signed)
  Problem: Education: Goal: Knowledge of [facility] Education information/materials will improve Outcome: Progressing   Problem: Education: Goal: Emotional status will improve Outcome: Progressing   Problem: Education: Goal: Mental status will improve Outcome: Progressing   Problem: Activity: Goal: Sleeping patterns will improve Outcome: Progressing   Problem: Coping: Goal: Ability to verbalize frustrations and anger appropriately will improve Outcome: Progressing   Problem: Safety: Goal: Periods of time without injury will increase Outcome: Progressing   Problem: Safety: Goal: Periods of time without injury will increase Outcome: Progressing

## 2023-01-01 NOTE — BH IP Treatment Plan (Signed)
Interdisciplinary Treatment and Diagnostic Plan Initial  01/01/2023 Time of Session: [address]. MRN: [id number]  Principal Diagnosis: Depression, major, recurrent, severe with psychosis (HCC)  Secondary Diagnoses: Principal Problem:   Depression, major, recurrent, severe with psychosis (HCC)   Current Medications:  Current Facility-Administered Medications  Medication Dose Route Frequency Provider Last Rate Last Admin   acetaminophen (TYLENOL) tablet 650 mg  650 mg Oral Q6H PRN Bobbye Morton, MD       alum & mag hydroxide-simeth (MAALOX/MYLANTA) 200-200-20 MG/5ML suspension 30 mL  30 mL Oral Q4H PRN Eliseo Gum B, MD   30 mL at 01/01/23 0046   amLODipine (NORVASC) tablet 5 mg  5 mg Oral Daily Eliseo Gum B, MD   5 mg at 01/01/23 3244   cloNIDine (CATAPRES) tablet 0.2 mg  0.2 mg Oral BID Eliseo Gum B, MD   0.2 mg at 01/01/23 0102   FLUoxetine (PROZAC) capsule 10 mg  10 mg Oral Daily Eliseo Gum B, MD   10 mg at 01/01/23 7253   hydrochlorothiazide (HYDRODIURIL) tablet 12.5 mg  12.5 mg Oral Daily Eliseo Gum B, MD   12.5 mg at 01/01/23 6644   hydrOXYzine (ATARAX) tablet 25 mg  25 mg Oral TID PRN Eliseo Gum B, MD   25 mg at 01/01/23 0135   OLANZapine zydis (ZYPREXA) disintegrating tablet 5 mg  5 mg Oral Q8H PRN Eliseo Gum B, MD   5 mg at 01/01/23 0126   And   LORazepam (ATIVAN) tablet 1 mg  1 mg Oral PRN Eliseo Gum B, MD       And   ziprasidone (GEODON) injection 20 mg  20 mg Intramuscular PRN Eliseo Gum B, MD       magnesium hydroxide (MILK OF MAGNESIA) suspension 30 mL  30 mL Oral Daily PRN Eliseo Gum B, MD       OLANZapine (ZYPREXA) tablet 5 mg  5 mg Oral QHS Eliseo Gum B, MD   5 mg at 12/31/22 2133   traZODone (DESYREL) tablet 50 mg  50 mg Oral QHS PRN Eliseo Gum B, MD   50 mg at 12/31/22 2133   PTA Medications: Medications Prior to Admission  Medication Sig Dispense Refill Last Dose   amLODipine (NORVASC) 10 MG tablet Take 1 tablet (10  mg total) by mouth daily.      cloNIDine (CATAPRES) 0.2 MG tablet Take 1 tablet (0.2 mg total) by mouth 2 (two) times daily.      docusate sodium (COLACE) 100 MG capsule Take 1 capsule (100 mg total) by mouth 2 (two) times daily as needed for mild constipation.      FLUoxetine (PROZAC) 10 MG capsule Take 1 capsule (10 mg total) by mouth daily.      hydrochlorothiazide (HYDRODIURIL) 12.5 MG tablet Take 1 tablet (12.5 mg total) by mouth daily.      OLANZapine (ZYPREXA) 5 MG tablet Take 1 tablet (5 mg total) by mouth at bedtime.       Patient Stressors: Marital or family conflict   Medication change or noncompliance    Patient Strengths: Printmaker for treatment/growth   Treatment Modalities: Medication Management, Group therapy, Case management,  1 to 1 session with clinician, Psychoeducation, Recreational therapy.   Physician Treatment Plan for Primary Diagnosis: Depression, major, recurrent, severe with psychosis (HCC) Long Term Goal(s):     Short Term Goals:    Medication Management: Evaluate patient's response, side effects, and tolerance of medication regimen.  Therapeutic  Interventions: 1 to 1 sessions, Unit Group sessions and Medication administration.  Evaluation of Outcomes: Progressing  Physician Treatment Plan for Secondary Diagnosis: Principal Problem:   Depression, major, recurrent, severe with psychosis (HCC)  Long Term Goal(s):     Short Term Goals:       Medication Management: Evaluate patient's response, side effects, and tolerance of medication regimen.  Therapeutic Interventions: 1 to 1 sessions, Unit Group sessions and Medication administration.  Evaluation of Outcomes: Progressing   RN Treatment Plan for Primary Diagnosis: Depression, major, recurrent, severe with psychosis (HCC) Long Term Goal(s): Knowledge of disease and therapeutic regimen to maintain health will improve  Short Term Goals: Ability to remain free from injury  will improve, Ability to verbalize frustration and anger appropriately will improve, Ability to demonstrate self-control, Ability to participate in decision making will improve, Ability to verbalize feelings will improve, Ability to disclose and discuss suicidal ideas, Ability to identify and develop effective coping behaviors will improve, and Compliance with prescribed medications will improve  Medication Management: RN will administer medications as ordered by provider, will assess and evaluate patient's response and provide education to patient for prescribed medication. RN will report any adverse and/or side effects to prescribing provider.  Therapeutic Interventions: 1 on 1 counseling sessions, Psychoeducation, Medication administration, Evaluate responses to treatment, Monitor vital signs and CBGs as ordered, Perform/monitor CIWA, COWS, AIMS and Fall Risk screenings as ordered, Perform wound care treatments as ordered.  Evaluation of Outcomes: Progressing   LCSW Treatment Plan for Primary Diagnosis: Depression, major, recurrent, severe with psychosis (HCC) Long Term Goal(s): Safe transition to appropriate next level of care at discharge, Engage patient in therapeutic group addressing interpersonal concerns.  Short Term Goals: Engage patient in aftercare planning with referrals and resources, Increase social support, Increase ability to appropriately verbalize feelings, Increase emotional regulation, Facilitate acceptance of mental health diagnosis and concerns, Facilitate patient progression through stages of change regarding substance use diagnoses and concerns, Identify triggers associated with mental health/substance abuse issues, and Increase skills for wellness and recovery  Therapeutic Interventions: Assess for all discharge needs, 1 to 1 time with Social worker, Explore available resources and support systems, Assess for adequacy in community support network, Educate family and  significant other(s) on suicide prevention, Complete Psychosocial Assessment, Interpersonal group therapy.  Evaluation of Outcomes: Progressing   Progress in Treatment: Attending groups: Yes. Participating in groups: Yes. Taking medication as prescribed: Yes. Toleration medication: Yes. Family/Significant other contact made: Yes, individual(s) contacted:  Bocephus Drozdowski Sr. (dad) [phone] Patient understands diagnosis: Yes. Discussing patient identified problems/goals with staff: Yes. Medical problems stabilized or resolved: Yes. Denies suicidal/homicidal ideation: Yes. Issues/concerns per patient self-inventory: Yes. Other: N/A  New problem(s) identified: No, Describe:  None reported  New Short Term/Long Term Goal(s): stabilization, elimination of SI thoughts, development of comprehensive mental wellness plan.   Patient Goals: Coping Skills   Discharge Plan or Barriers: CSW will continue to follow and assess for appropriate referrals and possible discharge planning.   Reason for Continuation of Hospitalization: Anxiety Delusions  Depression Medication stabilization Suicidal ideation  Estimated Length of Stay: 3-7 Days  Last 3 [place] Suicide Severity Risk Score: Flowsheet Row Ad[facility] (Current) [facility]-Admission (Discharged) from 12/27/2022 in [facility]  C-SSRS RISK CATEGORY High Risk High Risk       Last PHQ 2/9 Scores:     No data to display  medication stabilization, elimination of SI thoughts, development of comprehensive mental wellness plan.   Scribe for Treatment Team: Ane Payment, LCSW 01/01/2023 12:36 PM
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
## 2023-01-02 DIAGNOSIS — F333 Major depressive disorder, recurrent, severe with psychotic symptoms: Secondary | ICD-10-CM | POA: Diagnosis not present

## 2023-01-02 LAB — CULTURE, BLOOD (ROUTINE X 2): Culture: NO GROWTH

## 2023-01-02 LAB — BASIC METABOLIC PANEL
Anion gap: 9 (ref 5–15)
BUN: 16 mg/dL (ref 6–20)
CO2: 27 mmol/L (ref 22–32)
Calcium: 9.3 mg/dL (ref 8.9–10.3)
Chloride: 101 mmol/L (ref 98–111)
Creatinine, Ser: 0.81 mg/dL (ref 0.61–1.24)
GFR, Estimated: >60 mL/min (ref >60)
Glucose, Bld: 75 mg/dL (ref 70–99)
Potassium: 3.9 mmol/L (ref 3.5–5.1)
Sodium: 137 mmol/L (ref 135–145)

## 2023-01-02 LAB — TSH: TSH: 1.841 u[IU]/mL (ref 0.350–4.500)

## 2023-01-02 LAB — LIPID PANEL
Cholesterol: 158 mg/dL (ref 0–200)
HDL: 45 mg/dL (ref >40)
LDL Cholesterol: 101 mg/dL — ABNORMAL HIGH (ref 0–99)
Total CHOL/HDL Ratio: 3.5 {ratio}
Triglycerides: 61 mg/dL (ref <150)
VLDL: 12 mg/dL (ref 0–40)

## 2023-01-02 LAB — HEMOGLOBIN A1C
Hgb A1c MFr Bld: 4.8 % (ref 4.8–5.6)
Mean Plasma Glucose: 91.06 mg/dL

## 2023-01-02 LAB — VITAMIN D 25 HYDROXY (VIT D DEFICIENCY, FRACTURES): Vit D, 25-Hydroxy: 40.6 ng/mL (ref 30–100)

## 2023-01-02 MED ORDER — FLUOXETINE HCL 20 MG PO CAPS
20.0000 mg | ORAL_CAPSULE | Freq: Every day | ORAL | Status: DC
  Administered 2023-01-03 – 2023-01-06 (×4): 20 mg via ORAL
  Filled 2023-01-02 (×5): qty 1

## 2023-01-02 NOTE — Progress Notes (Signed)
Patrick Washington is ambulatory in hallway. He is quiet but appropriate and taking scheduled medications. He currently denies SI/HI/AVH Pleasant and cooperative "I am taking responsibilities for the things I've done and I'm trying to change" No complaints

## 2023-01-02 NOTE — Group Note (Signed)
Date:  01/02/2023 Time:  10:06 AM  Group Topic/Focus:  Goals Group:   The focus of this group is to help patients establish daily goals to achieve during treatment and discuss how the patient can incorporate goal setting into their daily lives to aide in recovery.    Participation Level:  Active  Participation Quality:  Appropriate  Affect:  Appropriate  Cognitive:  Appropriate  Insight: Appropriate  Engagement in Group:  Engaged  Modes of Intervention:  Discussion  Additional Comments:      Reymundo Poll 01/02/2023, 10:06 AM

## 2023-01-02 NOTE — Group Note (Signed)
LCSW Group Therapy Note   Group Date: 01/02/2023 Start Time: 1100 End Time: 1200   Type of Therapy and Topic:  Group Therapy: Boundaries  Participation Level:  Active  Description of Group: This group will address the use of boundaries in their personal lives. Patients will explore why boundaries are important, the difference between healthy and unhealthy boundaries, and negative and postive outcomes of different boundaries and will look at how boundaries can be crossed.  Patients will be encouraged to identify current boundaries in their own lives and identify what kind of boundary is being set. Facilitators will guide patients in utilizing problem-solving interventions to address and correct types boundaries being used and to address when no boundary is being used. Understanding and applying boundaries will be explored and addressed for obtaining and maintaining a balanced life. Patients will be encouraged to explore ways to assertively make their boundaries and needs known to significant others in their lives, using other group members and facilitator for role play, support, and feedback.  Therapeutic Goals:  1.  Patient will identify areas in their life where setting clear boundaries could be  used to improve their life.  2.  Patient will identify signs/triggers that a boundary is not being respected. 3.  Patient will identify two ways to set boundaries in order to achieve balance in  their lives: 4.  Patient will demonstrate ability to communicate their needs and set boundaries  through discussion and/or role plays  Summary of Patient Progress:  [place] was present/active throughout the session and proved open to feedback from CSW and peers. Patient demonstrated positive insight into the subject matter, was respectful of peers, and was present throughout the entire session.  Therapeutic Modalities:   Cognitive Behavioral Therapy Solution-Focused Therapy  Kathi Der, LCSWA 01/02/2023   12:25 PM

## 2023-01-02 NOTE — [facility] Suicide Risk Assessment (Signed)
[facility] INPATIENT:  Family/Significant Other Suicide Prevention Education  Suicide Prevention Education:  Education Completed; [employer] Jim Desanctis [phone],  (name of family member/significant other) has been identified by the patient as the family member/significant other with whom the patient will be residing, and identified as the person(s) who will aid the patient in the event of a mental health crisis (suicidal ideations/suicide attempt).  With written consent from the patient, the family member/significant other has been provided the following suicide prevention education, prior to the and/or following the discharge of the patient.  Spoke with mom this afternoon who states that she is worried about pt being discharged because she is worried pt will not be safe. Mom states that pt does not have anywhere to go. Mom states she does not feel safe with pt staying with him at discharge. Mom is not aware that pt has any access to guns or weapons.   The suicide prevention education provided includes the following: Suicide risk factors Suicide prevention and interventions National Suicide Hotline telephone number [facility] assessment telephone number [facility] Emergency Assistance 911 [facility] and/or Residential Mobile Crisis Unit telephone number  Request made of family/significant other to: Remove weapons (e.g., guns, rifles, knives), all items previously/currently identified as safety concern.   Remove drugs/medications (over-the-counter, prescriptions, illicit drugs), all items previously/currently identified as a safety concern.  The family member/significant other verbalizes understanding of the suicide prevention education information provided.  The family member/significant other agrees to remove the items of safety concern listed above.  Kathi Der 01/02/2023, 1:51 PM

## 2023-01-02 NOTE — [facility] Group Notes (Signed)
[facility] Group Notes:  (Nursing/MHT/Case Management/Adjunct)  Date:  01/02/2023  Time:  9:01 PM  Type of Therapy:   wrap of group  Participation Level:  Active  Participation Quality:  Appropriate and Sharing  Affect:  Appropriate  Cognitive:  Appropriate and Oriented  Insight:  Appropriate and Good  Engagement in Group:  Engaged  Modes of Intervention:  Discussion  Summary of Progress/Problems: Pt. Shared with the group how his day was good. He has no expectations of leaving soon.   Annell Greening Helen 01/02/2023, 9:01 PM
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
## 2023-01-03 DIAGNOSIS — F333 Major depressive disorder, recurrent, severe with psychotic symptoms: Secondary | ICD-10-CM | POA: Diagnosis not present

## 2023-01-03 NOTE — Group Note (Signed)
Recreation Therapy Group Note   Group Topic:Stress Management  Group Date: 01/03/2023 Start Time: 0930 End Time: 0955 Facilitators: Allicia Culley-McCall, LRT,CTRS Location: [address] Dayroom   Group Topic: Stress Management  Goal Area(s) Addresses:  Patient will identify positive stress management techniques. Patient will identify benefits of using stress management post d/c.  Activity: Meditation. LRT explained to patients the essence of meditation and to get as relaxed as possible. LRT played a meditation that guided patients to identify things that are holding them back from reaching the goals they see for themselves.   Education:  Stress Management, Discharge Planning.   Education Outcome: Acknowledges Education   Affect/Mood: N/A   Participation Level: Did not attend    Clinical Observations/Individualized Feedback:    Plan: Continue to engage patient in RT group sessions 2-3x/week.   Veer Elamin-McCall, LRT,CTRS  01/03/2023 12:57 PM

## 2023-01-03 NOTE — Progress Notes (Signed)
   01/03/23 0200  Psych Admission Type (Psych Patients Only)  Admission Status Involuntary  Psychosocial Assessment  Patient Complaints Anxiety  Eye Contact Fair  Facial Expression Anxious  Affect Anxious  Speech Logical/coherent  Interaction Minimal  Motor Activity Restless  Appearance/Hygiene Unremarkable  Behavior Characteristics Cooperative;Anxious  Mood Anxious  Thought Process  Coherency WDL  Content WDL  Delusions None reported or observed  Perception WDL  Hallucination None reported or observed  Judgment Impaired  Confusion None  Danger to Self  Current suicidal ideation? Denies  Agreement Not to Harm Self Yes  Description of Agreement verbal  Danger to Others  Danger to Others None reported or observed  Danger to Others Abnormal  Harmful Behavior to others No threats or harm toward other people  Destructive Behavior No threats or harm toward property

## 2023-01-03 NOTE — Progress Notes (Signed)
   01/03/23 0800  Psych Admission Type (Psych Patients Only)  Admission Status Involuntary  Psychosocial Assessment  Patient Complaints Anxiety  Eye Contact Fair  Facial Expression Anxious  Affect Anxious  Speech Logical/coherent  Interaction Minimal  Motor Activity Restless  Appearance/Hygiene Unremarkable  Behavior Characteristics Cooperative;Appropriate to situation  Mood Anxious  Thought Process  Coherency WDL  Content WDL  Delusions None reported or observed  Perception WDL  Hallucination None reported or observed  Judgment Impaired  Confusion None  Danger to Self  Current suicidal ideation? Denies  Agreement Not to Harm Self Yes  Description of Agreement Verbal  Danger to Others  Danger to Others None reported or observed  Danger to Others Abnormal  Harmful Behavior to others No threats or harm toward other people  Destructive Behavior No threats or harm toward property

## 2023-01-03 NOTE — Progress Notes (Signed)
     01/03/2023       1:54 PM   [name]   Type of Note: Update  Faxed over ROI to [facility] per MD request. Pt agreeable and signed ROI. Discharge summary requested to see what follow up care was advised for pt when he was d/c there in September.     Signed:  Keaten Mashek, LCSW-A 01/03/2023  1:54 PM
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
## 2023-01-03 NOTE — Plan of Care (Signed)
  Problem: Education: Goal: Knowledge of [facility] Education information/materials will improve Outcome: Progressing   Problem: Activity: Goal: Interest or engagement in activities will improve Outcome: Progressing   Problem: Coping: Goal: Ability to verbalize frustrations and anger appropriately will improve Outcome: Progressing   

## 2023-01-03 NOTE — Plan of Care (Signed)
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
## 2023-01-04 DIAGNOSIS — F333 Major depressive disorder, recurrent, severe with psychotic symptoms: Secondary | ICD-10-CM | POA: Diagnosis not present

## 2023-01-04 NOTE — Progress Notes (Signed)
   01/04/23 1700  Psych Admission Type (Psych Patients Only)  Admission Status Involuntary  Psychosocial Assessment  Patient Complaints Anxiety  Eye Contact Brief  Facial Expression Anxious  Affect Anxious  Speech Logical/coherent  Interaction Minimal  Motor Activity Restless  Appearance/Hygiene Unremarkable  Behavior Characteristics Cooperative  Mood Anxious  Thought Process  Coherency WDL  Content WDL  Delusions None reported or observed  Perception WDL  Hallucination None reported or observed  Judgment Impaired  Confusion None  Danger to Self  Current suicidal ideation? Denies  Agreement Not to Harm Self Yes  Description of Agreement verbal  Danger to Others  Danger to Others None reported or observed  Danger to Others Abnormal  Harmful Behavior to others No threats or harm toward other people  Destructive Behavior No threats or harm toward property

## 2023-01-04 NOTE — Progress Notes (Signed)
   01/04/23 0556  15 Minute Checks  Location Bedroom  Visual Appearance Calm  Behavior Sleeping  Sleep (Behavioral Health Patients Only)  Calculate sleep? (Click Yes once per 24 hr at 0600 safety check) Yes  Documented sleep last 24 hours 8.5

## 2023-01-04 NOTE — Progress Notes (Signed)
Patient alert and oriented. Calm, cooperative but anxious. Denies SI, HI, AVH, and pain. Scheduled medications administered to patient. Support and encouragement provided.  Routine safety checks conducted every 15 minutes.  Patient informed to notify staff with problems or concerns. Patient interacts well with others on the unit.  Patient remains safe at this time.

## 2023-01-04 NOTE — Plan of Care (Signed)
  Problem: Education: Goal: Knowledge of [facility] Education information/materials will improve Outcome: Progressing   Problem: Activity: Goal: Interest or engagement in activities will improve Outcome: Progressing   

## 2023-01-04 NOTE — [facility] Group Notes (Signed)
[facility] Group Notes:  (Nursing)  Date:  01/04/2023  Time:  1600  Type of Therapy:  Psychoeducational Skills  Participation Level:  Active  Participation Quality:  Appropriate and Attentive  Affect:  Appropriate  Cognitive:  Appropriate  Insight:  Improving  Engagement in Group:  Improving  Modes of Intervention:  Discussion, Exploration, Rapport Building, Socialization, and Support  Summary of Progress/Problems:  Shela Nevin 01/04/2023, 7:16 PM

## 2023-01-04 NOTE — [facility] Group Notes (Signed)
[facility] Group Notes:  (Nursing/MHT/Case Management/Adjunct)  Date:  01/03/2023  Time:  7:40 PM  Type of Therapy:   wrap-up group  Participation Level:  Did Not Attend  Participation Quality:    Affect:    Cognitive:    Insight:    Engagement in Group:    Modes of Intervention:    Summary of Progress/Problems:  Patrick Washington 01/04/2023, 7:40 PM
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
## 2023-01-04 NOTE — [facility] Group Notes (Signed)
[facility] Group Notes:  (Nursing/MHT/Case Management/Adjunct)  Date:  01/04/2023  Time:  8:51 PM  Type of Therapy:   Wrap-up group  Participation Level:  Active  Participation Quality:  Appropriate  Affect:  Appropriate  Cognitive:  Appropriate  Insight:  Appropriate  Engagement in Group:  Engaged  Modes of Intervention:  Education  Summary of Progress/Problems: Pt reports no goal. Rated day 3/10.  Noah Delaine 01/04/2023, 8:51 PM

## 2023-01-05 DIAGNOSIS — F333 Major depressive disorder, recurrent, severe with psychotic symptoms: Secondary | ICD-10-CM
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
## 2023-01-05 NOTE — [facility] Group Notes (Signed)
Did not participate in orientation group

## 2023-01-05 NOTE — Plan of Care (Signed)
  Problem: Activity: Goal: Interest or engagement in activities will improve Outcome: Progressing Goal: Sleeping patterns will improve Outcome: Progressing   Problem: Coping: Goal: Ability to verbalize frustrations and anger appropriately will improve Outcome: Progressing   

## 2023-01-05 NOTE — [facility] Group Notes (Signed)
Pt did not participate in emotional wellness group

## 2023-01-05 NOTE — Group Note (Signed)
Date:  01/05/2023 Time:  9:37 PM  Group Topic/Focus:  Wrap-Up Group:   The focus of this group is to help patients review their daily goal of treatment and discuss progress on daily workbooks.    Participation Level:  Active  Participation Quality:  Appropriate and Attentive  Affect:  Appropriate  Cognitive:  Alert and Appropriate  Insight: Appropriate  Engagement in Group:  Engaged  Modes of Intervention:  Discussion and Socialization  Additional Comments: pt stated that overall day was good. Was able to get some rest.  April Manson 01/05/2023, 9:37 PM

## 2023-01-05 NOTE — [facility] Group Notes (Signed)
Pt did not participate in orientation group.

## 2023-01-05 NOTE — Progress Notes (Signed)
   01/05/23 0100  Psych Admission Type (Psych Patients Only)  Admission Status Involuntary  Psychosocial Assessment  Patient Complaints Anxiety  Eye Contact Fair  Facial Expression Animated  Affect Anxious  Speech Logical/coherent  Interaction Assertive  Motor Activity Restless  Appearance/Hygiene Unremarkable  Behavior Characteristics Cooperative;Appropriate to situation  Mood Anxious  Thought Process  Coherency WDL  Content WDL  Delusions None reported or observed  Perception WDL  Hallucination None reported or observed  Judgment Poor  Confusion None  Danger to Self  Current suicidal ideation? Denies  Agreement Not to Harm Self Yes  Description of Agreement verbal  Danger to Others  Danger to Others None reported or observed  Danger to Others Abnormal  Harmful Behavior to others No threats or harm toward other people  Destructive Behavior No threats or harm toward property

## 2023-01-05 NOTE — [facility] Group Notes (Signed)
Type of Therapy and Topic:  Group Therapy: Self Care  Participation Level:  Did Not Attend   Description of Group:   In this group, patients shared and discussed the importance of acknowledging the elements in their lives for which they are taking care of their self mentally and how this can positively impact their mood.  The group discussed how bringing the positive elements of their lives to the forefront of their minds can help with recovery from any illness, physical or mental.  An exercise was done as a group in which a list was made for self care items in order to encourage participants to consider other potential positives in their lives.  Therapeutic Goals: Patients will identify one or more item for which they are taking care of themself mentally  Patients will discuss how it is possible to seek self care in even bad situations. Patients will explore other possible items of self care that they could remember.   Summary of Patient Progress:  NA  Therapeutic Modalities:   Cognitive Behavioral Therapy MI

## 2023-01-05 NOTE — Progress Notes (Signed)
   01/05/23 2210  Psych Admission Type (Psych Patients Only)  Admission Status Involuntary  Psychosocial Assessment  Patient Complaints Isolation  Eye Contact Fair  Facial Expression Animated  Affect Anxious  Speech Logical/coherent  Interaction Assertive  Motor Activity Restless  Appearance/Hygiene Unremarkable  Behavior Characteristics Cooperative;Appropriate to situation  Mood Anxious  Thought Process  Coherency WDL  Content WDL  Delusions None reported or observed  Perception WDL  Hallucination None reported or observed  Judgment Poor  Confusion None  Danger to Self  Current suicidal ideation? Denies  Agreement Not to Harm Self Yes  Description of Agreement verbal  Danger to Others  Danger to Others None reported or observed  Danger to Others Abnormal  Harmful Behavior to others No threats or harm toward other people  Destructive Behavior No threats or harm toward property

## 2023-01-06 ENCOUNTER — Encounter (HOSPITAL_COMMUNITY): Payer: Self-pay | Admitting: Registered Nurse

## 2023-01-06 DIAGNOSIS — F333 Major depressive disorder, recurrent, severe with psychotic symptoms: Secondary | ICD-10-CM | POA: Diagnosis not present

## 2023-01-06 MED ORDER — AMLODIPINE BESYLATE 5 MG PO TABS: 5.0000 mg | ORAL_TABLET | Freq: Every day | ORAL | 0 refills | Status: DC

## 2023-01-06 MED ORDER — HYDROCHLOROTHIAZIDE 12.5 MG PO TABS: 12.5000 mg | ORAL_TABLET | Freq: Every day | ORAL | 0 refills | Status: DC

## 2023-01-06 MED ORDER — HYDROXYZINE HCL 25 MG PO TABS: 25.0000 mg | ORAL_TABLET | Freq: Three times a day (TID) | ORAL | 0 refills | Status: DC | PRN

## 2023-01-06 MED ORDER — ARIPIPRAZOLE 5 MG PO TABS: 5.0000 mg | ORAL_TABLET | Freq: Every day | ORAL | 0 refills | Status: DC

## 2023-01-06 MED ORDER — FLUOXETINE HCL 20 MG PO CAPS: 20.0000 mg | ORAL_CAPSULE | Freq: Every day | ORAL | 3 refills | Status: DC

## 2023-01-06 MED ORDER — CLONIDINE HCL 0.2 MG PO TABS: 0.2000 mg | ORAL_TABLET | Freq: Two times a day (BID) | ORAL | 0 refills | Status: DC

## 2023-01-06 MED ORDER — TRAZODONE HCL 50 MG PO TABS: 50.0000 mg | ORAL_TABLET | Freq: Every evening | ORAL | 0 refills | Status: DC | PRN

## 2023-01-06 NOTE — Plan of Care (Signed)
  Problem: Education: Goal: Knowledge of [facility] Education information/materials will improve Outcome: Progressing   Problem: Activity: Goal: Interest or engagement in activities will improve Outcome: Progressing   Problem: Coping: Goal: Ability to verbalize frustrations and anger appropriately will improve Outcome: Progressing   

## 2023-01-06 NOTE — Progress Notes (Signed)
  [facility] Adult Case Management Discharge Plan :  Will you be returning to the same living situation after discharge:  No.Patient will be going to [facility] rescue mission  At discharge, do you have transportation home?: Yes,  Patient mom will be here around 10:45 AM  Do you have the ability to pay for your medications: Yes,  [employer] Plan   Release of information consent forms completed and in the chart;  Patient's signature needed at discharge.  Patient to Follow up at:  Follow-up Information     [facility] Follow up on 01/10/2023.   Why: You have a hospital follow up appointment for therapy and medication management services on 01/10/23 at 10:30 am .   The appointment will be Virtual telehealth. Contact information: [address]  [address] [place] [address] [phone]                 Next level of care provider has access to [facility] Link:no  Safety Planning and Suicide Prevention discussed: Yes,  Lexus Jim Desanctis (mom) [phone]      Has patient been referred to the Quitline?: Patient refused referral for treatment; Pt does smoke marijuana   Patient has been referred for addiction treatment: Yes, the patient will follow up with an outpatient provider for substance use disorder. Psychiatrist/APP: appointment made; Patient was also accepted into [facility]   Isabella Bowens, [place] 01/06/2023, 9:43 AM
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
## 2023-01-06 NOTE — Plan of Care (Signed)
  Problem: Education: Goal: Knowledge of [facility] Education information/materials will improve Outcome: Completed/Met Goal: Emotional status will improve Outcome: Completed/Met Goal: Mental status will improve Outcome: Completed/Met Goal: Verbalization of understanding the information provided will improve Outcome: Completed/Met   Problem: Activity: Goal: Interest or engagement in activities will improve Outcome: Completed/Met Goal: Sleeping patterns will improve Outcome: Completed/Met   
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
## 2023-01-06 NOTE — Progress Notes (Signed)
[name]  D/C'd Home per MD order.  Discussed with the patient and all questions fully answered.   An After Visit Summary was printed and given to the patient. Patient received prescription.  D/c education completed with patient and family including follow up instructions, medication list, d/c activities limitations if indicated, with other d/c instructions as indicated by MD - patient able to verbalize understanding, all questions fully answered. Printed prescription paper given to mom to pick up medication at the pharmacy.    Patient instructed to return to ED, call 911, or call MD for any changes in condition.   Patient escorted to the main entrance , and D/C home via private auto.  Melvenia Needles 01/06/2023 11:47 AM

## 2023-06-27 ENCOUNTER — Ambulatory Visit (HOSPITAL_COMMUNITY)
Admission: EM | Admit: 2023-06-27 | Discharge: 2023-06-30 | Disposition: A | Discharge status: Home / Self Care | Payer: MEDICAID | Attending: Psychiatry | Admitting: Psychiatry

## 2023-06-27 DIAGNOSIS — I10 Essential (primary) hypertension: Secondary | ICD-10-CM | POA: Insufficient documentation

## 2023-06-27 DIAGNOSIS — Z652 Problems related to release from prison: Secondary | ICD-10-CM | POA: Insufficient documentation

## 2023-06-27 DIAGNOSIS — Z5901 Sheltered homelessness: Secondary | ICD-10-CM | POA: Insufficient documentation

## 2023-06-27 DIAGNOSIS — Z79899 Other long term (current) drug therapy: Secondary | ICD-10-CM | POA: Diagnosis not present

## 2023-06-27 DIAGNOSIS — R9431 Abnormal electrocardiogram [ECG] [EKG]: Secondary | ICD-10-CM | POA: Diagnosis not present

## 2023-06-27 DIAGNOSIS — Z560 Unemployment, unspecified: Secondary | ICD-10-CM | POA: Insufficient documentation

## 2023-06-27 DIAGNOSIS — Z9151 Personal history of suicidal behavior: Secondary | ICD-10-CM | POA: Insufficient documentation

## 2023-06-27 DIAGNOSIS — Z87820 Personal history of traumatic brain injury: Secondary | ICD-10-CM | POA: Insufficient documentation

## 2023-06-27 DIAGNOSIS — F333 Major depressive disorder, recurrent, severe with psychotic symptoms: Secondary | ICD-10-CM | POA: Diagnosis not present

## 2023-06-27 LAB — POCT URINE DRUG SCREEN - MANUAL ENTRY (I-SCREEN)
POC Amphetamine UR: NOT DETECTED
POC Buprenorphine (BUP): NOT DETECTED
POC Cocaine UR: NOT DETECTED
POC Marijuana UR: NOT DETECTED
POC Methadone UR: NOT DETECTED
POC Methamphetamine UR: NOT DETECTED
POC Morphine: NOT DETECTED
POC Oxazepam (BZO): NOT DETECTED
POC Oxycodone UR: NOT DETECTED
POC Secobarbital (BAR): NOT DETECTED

## 2023-06-27 LAB — COMPREHENSIVE METABOLIC PANEL WITH GFR
ALT: 42 U/L (ref 0–44)
AST: 38 U/L (ref 15–41)
Albumin: 4.4 g/dL (ref 3.5–5.0)
Alkaline Phosphatase: 45 U/L (ref 38–126)
Anion gap: 9 (ref 5–15)
BUN: 8 mg/dL (ref 6–20)
CO2: 27 mmol/L (ref 22–32)
Calcium: 10 mg/dL (ref 8.9–10.3)
Chloride: 105 mmol/L (ref 98–111)
Creatinine, Ser: 0.95 mg/dL (ref 0.61–1.24)
GFR, Estimated: >60 mL/min (ref >60)
Glucose, Bld: 98 mg/dL (ref 70–99)
Potassium: 3.7 mmol/L (ref 3.5–5.1)
Sodium: 141 mmol/L (ref 135–145)
Total Bilirubin: 0.6 mg/dL (ref 0.0–1.2)
Total Protein: 7.4 g/dL (ref 6.5–8.1)

## 2023-06-27 LAB — CBC WITH DIFFERENTIAL/PLATELET
Abs Immature Granulocytes: 0.02 10*3/uL (ref 0.00–0.07)
Basophils Absolute: 0.1 10*3/uL (ref 0.0–0.1)
Basophils Relative: 1 %
Eosinophils Absolute: 0.5 10*3/uL (ref 0.0–0.5)
Eosinophils Relative: 7 %
HCT: 45.6 % (ref 39.0–52.0)
Hemoglobin: 15.5 g/dL (ref 13.0–17.0)
Immature Granulocytes: 0 %
Lymphocytes Relative: 34 %
Lymphs Abs: 2.5 10*3/uL (ref 0.7–4.0)
MCH: 29.6 pg (ref 26.0–34.0)
MCHC: 34 g/dL (ref 30.0–36.0)
MCV: 87 fL (ref 80.0–100.0)
Monocytes Absolute: 0.6 10*3/uL (ref 0.1–1.0)
Monocytes Relative: 8 %
Neutro Abs: 3.7 10*3/uL (ref 1.7–7.7)
Neutrophils Relative %: 50 %
Platelets: 252 10*3/uL (ref 150–400)
RBC: 5.24 MIL/uL (ref 4.22–5.81)
RDW: 13.3 % (ref 11.5–15.5)
WBC: 7.3 10*3/uL (ref 4.0–10.5)
nRBC: 0 % (ref 0.0–0.2)

## 2023-06-27 LAB — ETHANOL: Alcohol, Ethyl (B): <15 mg/dL (ref <15)

## 2023-06-27 LAB — HEMOGLOBIN A1C
Hgb A1c MFr Bld: 4.8 % (ref 4.8–5.6)
Mean Plasma Glucose: 91.06 mg/dL

## 2023-06-27 LAB — TSH: TSH: 0.713 u[IU]/mL (ref 0.350–4.500)

## 2023-06-27 MED ORDER — LORAZEPAM 2 MG/ML IJ SOLN
2.0000 mg | Freq: Three times a day (TID) | INTRAMUSCULAR | Status: DC | PRN
  Administered 2023-06-27: 2 mg via INTRAMUSCULAR

## 2023-06-27 MED ORDER — FLUOXETINE HCL 20 MG PO CAPS
20.0000 mg | ORAL_CAPSULE | Freq: Every day | ORAL | Status: DC
  Administered 2023-06-28 – 2023-06-30 (×3): 20 mg via ORAL
  Filled 2023-06-27 (×3): qty 1
  Filled 2023-06-27: qty 7

## 2023-06-27 MED ORDER — ALUM & MAG HYDROXIDE-SIMETH 200-200-20 MG/5ML PO SUSP: 30.0000 mL | ORAL | Status: DC | PRN

## 2023-06-27 MED ORDER — DIPHENHYDRAMINE HCL 50 MG/ML IJ SOLN
50.0000 mg | Freq: Three times a day (TID) | INTRAMUSCULAR | Status: DC | PRN
  Administered 2023-06-27: 50 mg via INTRAMUSCULAR

## 2023-06-27 MED ORDER — TRAZODONE HCL 50 MG PO TABS
50.0000 mg | ORAL_TABLET | Freq: Every evening | ORAL | Status: DC | PRN
  Administered 2023-06-27 – 2023-06-29 (×3): 50 mg via ORAL
  Filled 2023-06-27: qty 7
  Filled 2023-06-27 (×3): qty 1

## 2023-06-27 MED ORDER — LORAZEPAM 2 MG/ML IJ SOLN
2.0000 mg | Freq: Three times a day (TID) | INTRAMUSCULAR | Status: DC | PRN
  Filled 2023-06-27: qty 1

## 2023-06-27 MED ORDER — ACETAMINOPHEN 325 MG PO TABS: 650.0000 mg | ORAL_TABLET | Freq: Four times a day (QID) | ORAL | Status: DC | PRN

## 2023-06-27 MED ORDER — AMLODIPINE BESYLATE 5 MG PO TABS
5.0000 mg | ORAL_TABLET | Freq: Every day | ORAL | Status: DC
  Administered 2023-06-28 – 2023-06-30 (×3): 5 mg via ORAL
  Filled 2023-06-27 (×3): qty 1
  Filled 2023-06-27: qty 7

## 2023-06-27 MED ORDER — HALOPERIDOL LACTATE 5 MG/ML IJ SOLN: 10.0000 mg | Freq: Three times a day (TID) | INTRAMUSCULAR | Status: DC | PRN

## 2023-06-27 MED ORDER — DIPHENHYDRAMINE HCL 50 MG/ML IJ SOLN
50.0000 mg | Freq: Three times a day (TID) | INTRAMUSCULAR | Status: DC | PRN
  Filled 2023-06-27: qty 1

## 2023-06-27 MED ORDER — HALOPERIDOL LACTATE 5 MG/ML IJ SOLN
5.0000 mg | Freq: Three times a day (TID) | INTRAMUSCULAR | Status: DC | PRN
  Administered 2023-06-27: 5 mg via INTRAMUSCULAR
  Filled 2023-06-27: qty 1

## 2023-06-27 MED ORDER — HALOPERIDOL 5 MG PO TABS: 5.0000 mg | ORAL_TABLET | Freq: Three times a day (TID) | ORAL | Status: DC | PRN

## 2023-06-27 MED ORDER — ARIPIPRAZOLE 5 MG PO TABS: 5.0000 mg | ORAL_TABLET | Freq: Every day | ORAL | Status: DC

## 2023-06-27 MED ORDER — DIPHENHYDRAMINE HCL 50 MG PO CAPS: 50.0000 mg | ORAL_CAPSULE | Freq: Three times a day (TID) | ORAL | Status: DC | PRN

## 2023-06-27 MED ORDER — HYDROXYZINE HCL 25 MG PO TABS
25.0000 mg | ORAL_TABLET | Freq: Three times a day (TID) | ORAL | Status: DC | PRN
  Administered 2023-06-29: 25 mg via ORAL
  Filled 2023-06-27: qty 1
  Filled 2023-06-27: qty 10

## 2023-06-27 MED ORDER — MAGNESIUM HYDROXIDE 400 MG/5ML PO SUSP: 30.0000 mL | Freq: Every day | ORAL | Status: DC | PRN

## 2023-06-27 MED ORDER — ARIPIPRAZOLE 5 MG PO TABS
5.0000 mg | ORAL_TABLET | Freq: Every day | ORAL | Status: DC
  Administered 2023-06-27 – 2023-06-29 (×3): 5 mg via ORAL
  Filled 2023-06-27: qty 1
  Filled 2023-06-27: qty 7
  Filled 2023-06-27 (×2): qty 1

## 2023-06-27 NOTE — ED Notes (Signed)
 Patient resting quietly in bed with eyes open, Respirations equal and unlabored, skin warm and dry, NAD. No change in assessment or acuity. Routine safety checks conducted according to facility protocol. Will continue to monitor for safety.

## 2023-06-27 NOTE — ED Provider Notes (Signed)
 [facility] Continuous Assessment Admission H&P  Date: 06/27/23 Patient Name: Patrick Washington. MRN: [id number] Chief Complaint: "I just got released from jail and ordered to come here first".   Diagnoses:  Final diagnoses:  Severe episode of recurrent major depressive disorder, with psychotic features (HCC)    HPI: Patrick Washington 33 y.o., male patient presented to [facility] as a voluntary walk in accompanied by father with complaints of self injures behaviors by banging his head on walls and wanting to get back on the medications that he was previously on at [facility].  Patient has a past psychiatric history of MDD with psychotic features, suicide attempt, and traumatic brain injury.  Medical history pertinent for hypertension and history of a TBI.  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  Patient states that he thinks he was receiving lisinopril and Lexapro while in prison but is not sure if that is the correct medication and is unsure of the dosage.  Patrick D Bonello Jr., is seen face to face by this provider, consulted with Dr. Docia Freeman; and chart reviewed on 06/27/23.  On evaluation Patrick Washington. Reports " I was discharged from jail yesterday and stayed the night at my dad's house but I cannot stay there because he was at the senior only place.  I had court yesterday and they told me that I was supposed to have a bed at [facility] but that I had to come here first.  I am not sure why though.  I was at the hospital right before I went to jail and the medications they had me on and pretty sure were working.  I did try to commit suicide last year by overdosing on some medications.  I do not feel suicidal right now but I do feel like banging my head on the wall.  I am not hearing or seeing things but I have weird thoughts like I feel like people speak to me through different signs for  example if I see a crumb on my cake or something like that then I feel like it is a sign that I am small and wanting something that is too big for me or that the girl that I like thinks that she is better than me." Pt reports that he is currently on probation and supposed to see his probation officer twice a week. Her name is Aisha Ali [phone]. I attempted to contact her to notify that pt is staying here at the hospital and to also discuss what the plan was with courts sending him here.  Patient reports that he was told that he had [facility], [facility] and [facility] however they did not have this patient's name on the list.  Informed patient of this and he was disappointed and reports that he has nowhere to go.  We discussed restarting the medications that he was previously on including Abilify , Prozac  and amlodipine .  Patient is agreeable to this.  Discussed that patient will be kept overnight for observation since restarting medications.  Per chart review patient seems to have a history of assaulting security and police unprovoked.  Staff is made aware of this.   This Clinical research associate spoke with pt's father with pt consent.  Father reports that he had just spoke with his public defender who  informed him of having an appointment at the [facility] at 2:30 on Monday to discuss resources including the [employer].  Father states that he can come pick patient up from here on Monday and take him to [facility] for this appointment.  Father states that the [employer] will be able to provide housing to him until the courts work out finding him a bed at [facility].   During evaluation Conway Fedora. is standing in hallway, in no acute distress.  He is alert & oriented x 4, calm, cooperative and attentive for this assessment.  His mood is depressed with congruent flat affect.  He has normal speech, and behavior however, appears somewhat  suspicious of other patients in the hallway.  Objectively there is no evidence of psychosis/mania or delusional thinking, however patient does report having persecutory delusions that others think less of him . Pt does not appear to be responding to internal or external stimuli.  Patient is able to converse coherently with goal directed thoughts.   He currently denies suicidal, homicidal ideation, psychosis, and paranoia.  Patient answered assessment question appropriately.    Total Time spent with patient: 45 minutes  Musculoskeletal  Strength & Muscle Tone: within normal limits Gait & Station: normal Patient leans: N/A  Psychiatric Specialty Exam  Presentation General Appearance:  Appropriate for Environment  Eye Contact: Good  Speech: Clear and Coherent  Speech Volume: Normal  Handedness: Right   Mood and Affect  Mood: Depressed  Affect: Depressed; Flat   Thought Process  Thought Processes: Coherent; Linear  Descriptions of Associations:Intact  Orientation:Full (Time, Place and Person)  Thought Content:WDL  Diagnosis of Schizophrenia or Schizoaffective disorder in past: No   Hallucinations:Hallucinations: None  Ideas of Reference:Delusions  Suicidal Thoughts:Suicidal Thoughts: No  Homicidal Thoughts:Homicidal Thoughts: No   Sensorium  Memory: Immediate Fair; Recent Fair  Judgment: Fair  Insight: Fair   Art therapist  Concentration: Fair  Attention Span: Fair  Recall: [employer] of Knowledge: Fair  Language: Fair   Psychomotor Activity  Psychomotor Activity: Psychomotor Activity: Restlessness   Assets  Assets: Desire for Improvement; Housing; Physical Health; Social Support; Vocational/Educational   Sleep  Sleep: Sleep: Good Number of Hours of Sleep: 8   Nutritional Assessment (For OBS and FBC admissions only) Has the patient had a weight loss or gain of 10 pounds or more in the last 3 months?: No Has the  patient had a decrease in food intake/or appetite?: No Does the patient have dental problems?: No Does the patient have eating habits or behaviors that may be indicators of an eating disorder including binging or inducing vomiting?: No Has the patient recently lost weight without trying?: 0 Has the patient been eating poorly because of a decreased appetite?: 0 Malnutrition Screening Tool Score: 0    Physical Exam Vitals and nursing note reviewed.  Constitutional:      Appearance: Normal appearance.  HENT:     Head: Normocephalic.     Nose: Nose normal.  Eyes:     Extraocular Movements: Extraocular movements intact.  Cardiovascular:     Rate and Rhythm: Normal rate.  Pulmonary:     Effort: Pulmonary effort is normal.  Musculoskeletal:        General: Normal range of motion.     Cervical back: Normal range of motion.  Neurological:     General: No focal deficit present.     Mental Status: He is alert and oriented to person, place, and time.  Review of Systems  Constitutional: Negative.   HENT: Negative.    Eyes: Negative.   Respiratory: Negative.    Cardiovascular: Negative.   Gastrointestinal: Negative.   Genitourinary: Negative.   Musculoskeletal: Negative.   Neurological: Negative.   Endo/Heme/Allergies: Negative.   Psychiatric/Behavioral:  Positive for depression.     Blood pressure 132/77, pulse 88, resp. rate 20, SpO2 100%. There is no height or weight on file to calculate BMI.  Past Psychiatric History: MDD with psychotic features, TBI, acute psychosis, suicide attempt and aggressive behaviors.   Is the patient at risk to self? No  Has the patient been a risk to self in the past 6 months? No .    Has the patient been a risk to self within the distant past? Yes Is the patient a risk to others? No   Has the patient been a risk to others in the past 6 months? No   Has the patient been a risk to others within the distant past? Yes   Past Medical History: TBI  and HTN  Family History: Father- depression/ anxiety  Social History: Pt released from jail yesterday, stayed with father overnight but cannot stay there anymore bc it is for seniors only. Pt and father were under the impression that he has a bed at [facility] but we called and he does not have a bed there.  Father reports that his public defender made an appointment with the [facility] at 2:30 on Monday to get set up with an opportunity with the [employer] for temporary housing.  Patient denies substance use.  He is currently unemployed.  Patient is currently on probation seeing his probation officer twice a week.  Probation officer's name is Officer Aisha Ali her phone number is [phone].  I attempted to call Officer to let them know that patient is here but no answer, HIPAA compliant voicemail was left to return call.   Last Labs:  Admission on 12/31/2022, Discharged on 01/06/2023  Component Date Value Ref Range Status   Glucose-Capillary 01/01/2023 102 (H)  70 - 99 mg/dL Final   Glucose reference range applies only to samples taken after fasting for at least 8 hours.   Comment 1 01/01/2023 Notify RN   Final   Sodium 01/02/2023 137  135 - 145 mmol/L Final   Potassium 01/02/2023 3.9  3.5 - 5.1 mmol/L Final   Chloride 01/02/2023 101  98 - 111 mmol/L Final   CO2 01/02/2023 27  22 - 32 mmol/L Final   Glucose, Bld 01/02/2023 75  70 - 99 mg/dL Final   Glucose reference range applies only to samples taken after fasting for at least 8 hours.   BUN 01/02/2023 16  6 - 20 mg/dL Final   Creatinine, Ser 01/02/2023 0.81  0.61 - 1.24 mg/dL Final   Calcium  01/02/2023 9.3  8.9 - 10.3 mg/dL Final   GFR, Estimated 01/02/2023 >60  >60 mL/min Final   Comment: (NOTE) Calculated using the CKD-EPI Creatinine Equation (2021)    Anion gap 01/02/2023 9  5 - 15 Final   Performed at [facility], [address] [place], [place] [address]   Hgb A1c MFr Bld 01/02/2023 4.8  4.8 - 5.6 % Final    Comment: (NOTE) Pre diabetes:          5.7%-6.4%  Diabetes:              >6.4%  Glycemic control for   <7.0% adults with diabetes  Mean Plasma Glucose 01/02/2023 91.06  mg/dL Final   Performed at [facility], [address], [place], [place] [address]   TSH 01/02/2023 1.841  0.350 - 4.500 uIU/mL Final   Comment: Performed by a 3rd Generation assay with a functional sensitivity of <=0.01 uIU/mL. Performed at [facility], [address] [place], [place] [address]    Cholesterol 01/02/2023 158  0 - 200 mg/dL Final   Triglycerides 47/82/9562 61  <150 mg/dL Final   HDL 13/10/6576 45  >40 mg/dL Final   Total CHOL/HDL Ratio 01/02/2023 3.5  RATIO Final   VLDL 01/02/2023 12  0 - 40 mg/dL Final   LDL Cholesterol 01/02/2023 101 (H)  0 - 99 mg/dL Final   Comment:        Total Cholesterol/HDL:CHD Risk Coronary Heart Disease Risk Table                     Men   Women  1/2 Average Risk   3.4   3.3  Average Risk       5.0   4.4  2 X Average Risk   9.6   7.1  3 X Average Risk  23.4   11.0        Use the calculated Patient Ratio above and the CHD Risk Table to determine the patient's CHD Risk.        ATP III CLASSIFICATION (LDL):  <100     mg/dL   Optimal  469-629  mg/dL   Near or Above                    Optimal  130-159  mg/dL   Borderline  528-413  mg/dL   High  >244     mg/dL   Very High Performed at [facility], [address], [place], [place] [address]    Vit D, 25-Hydroxy 01/02/2023 40.60  30 - 100 ng/mL Final   Comment: (NOTE) Vitamin D  deficiency has been defined by the Institute of Medicine  and an Endocrine Society practice guideline as a level of serum 25-OH  vitamin D  less than 20 ng/mL (1,2). The Endocrine Society went on to  further define vitamin D  insufficiency as a level between 21 and 29  ng/mL (2).  1. IOM (Institute of Medicine). 2010. Dietary reference intakes for  calcium  and D. [place]: [employer]. 2. Holick MF, Binkley [place], Bischoff-Ferrari HA, et al. Evaluation,  treatment, and prevention of vitamin D  deficiency: an Endocrine  Society clinical practice guideline, JCEM. 2011 Jul; 96(7): 1911-30.  Performed at [facility], [address] [place], [place] [address]   No results displayed because visit has over 200 results.      Allergies: Codeine  Medications:  Facility Ordered Medications  Medication   acetaminophen  (TYLENOL ) tablet 650 mg   alum & mag hydroxide-simeth (MAALOX/MYLANTA) 200-200-20 MG/5ML suspension 30 mL   magnesium  hydroxide (MILK OF MAGNESIA) suspension 30 mL   haloperidol  (HALDOL ) tablet 5 mg   And   diphenhydrAMINE  (BENADRYL ) capsule 50 mg   haloperidol  lactate (HALDOL ) injection 5 mg   And   diphenhydrAMINE  (BENADRYL ) injection 50 mg   And   LORazepam  (ATIVAN ) injection 2 mg   haloperidol  lactate (HALDOL ) injection 10 mg   And   diphenhydrAMINE  (BENADRYL ) injection 50 mg   And   LORazepam  (ATIVAN ) injection 2 mg   hydrOXYzine  (ATARAX ) tablet 25 mg  traZODone  (DESYREL ) tablet 50 mg   [START ON 06/28/2023] FLUoxetine  (PROZAC ) capsule 20 mg   [START ON 06/28/2023] ARIPiprazole  (ABILIFY ) tablet 5 mg   [START ON 06/28/2023] amLODipine  (NORVASC ) tablet 5 mg   PTA Medications  Medication Sig   escitalopram (LEXAPRO) 10 MG tablet Take 10 mg by mouth daily. Says taking Lexapro in jail- no pharmacy records available unsure of strength   ARIPiprazole  (ABILIFY ) 5 MG tablet Take 1 tablet (5 mg total) by mouth daily.   traZODone  (DESYREL ) 50 MG tablet Take 1 tablet (50 mg total) by mouth at bedtime as needed for sleep.   amLODipine  (NORVASC ) 5 MG tablet Take 1 tablet (5 mg total) by mouth daily. (Patient not taking: Reported on 06/27/2023)   hydrOXYzine  (ATARAX ) 25 MG tablet Take 1 tablet (25 mg total) by mouth 3 (three) times daily as needed for anxiety. (Patient not taking: Reported on 06/27/2023)   cloNIDine  (CATAPRES ) 0.2 MG tablet  Take 1 tablet (0.2 mg total) by mouth 2 (two) times daily. (Patient not taking: Reported on 06/27/2023)   FLUoxetine  (PROZAC ) 20 MG capsule Take 1 capsule (20 mg total) by mouth daily. (Patient not taking: Reported on 06/27/2023)   hydrochlorothiazide  (HYDRODIURIL ) 12.5 MG tablet Take 1 tablet (12.5 mg total) by mouth daily. (Patient not taking: Reported on 06/27/2023)      Medical Decision Making  Patient will be admitted to continuous assessment for overnight observation since restarting psychiatric medications.  After my assessment with the patient, nursing reported that patient was banging his head on the wall.  Patient states he does not know why he banged his head on the wall that he would not did again.  Patient also reports having some delusional thoughts about people sending him messages through different signs.  Patient will be reassessed in the morning to discuss plan to discharge on Monday for his appointment at [facility] at 2:30 PM.  Treatment plan: - Patient admitted to continuous assessment for overnight observation.  - Will restart psychiatric and hypertensive medications: Meds ordered this encounter  Medications   acetaminophen  (TYLENOL ) tablet 650 mg   alum & mag hydroxide-simeth (MAALOX/MYLANTA) 200-200-20 MG/5ML suspension 30 mL   magnesium  hydroxide (MILK OF MAGNESIA) suspension 30 mL   AND Linked Order Group    haloperidol  (HALDOL ) tablet 5 mg    diphenhydrAMINE  (BENADRYL ) capsule 50 mg   AND Linked Order Group    haloperidol  lactate (HALDOL ) injection 5 mg    diphenhydrAMINE  (BENADRYL ) injection 50 mg    LORazepam  (ATIVAN ) injection 2 mg   AND Linked Order Group    haloperidol  lactate (HALDOL ) injection 10 mg    diphenhydrAMINE  (BENADRYL ) injection 50 mg    LORazepam  (ATIVAN ) injection 2 mg   hydrOXYzine  (ATARAX ) tablet 25 mg   traZODone  (DESYREL ) tablet 50 mg   FLUoxetine  (PROZAC ) capsule 20 mg   ARIPiprazole  (ABILIFY ) tablet 5 mg   amLODipine  (NORVASC ) tablet 5 mg    Lab Orders         CBC with Differential/Platelet         Comprehensive metabolic panel         Hemoglobin A1c         Ethanol         TSH         POCT Urine Drug Screen - (I-Screen)     EKG    Recommendations  Based on my evaluation the patient does not appear to have an emergency medical condition.  Davia Erps, NP 06/27/23  5:00 PM
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
## 2023-06-27 NOTE — ED Notes (Signed)
 Patient A&Ox4. Patient denies SI/HI and AVH. Patient denies any physical complaints when asked. No acute distress noted. Support and encouragement provided. Routine safety checks conducted according to facility protocol. Encouraged patient to notify staff if thoughts of harm toward self or others arise. Patient verbalize understanding and agreement. Will continue to monitor for safety.
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
## 2023-06-27 NOTE — ED Notes (Signed)
 Patient observed hitting his head repeatedly on the wall. Patient did stoop when asked to do so. Pt requested medication. Please see MAR.

## 2023-06-28 NOTE — ED Notes (Signed)
 Patient resting quietly in bed with eyes closed. Respirations equal and unlabored, skin warm and dry, NAD. Routine safety checks conducted according to facility protocol. Will continue to monitor for safety.

## 2023-06-28 NOTE — ED Notes (Signed)
 Patient A&Ox4. Denies intent to harm self/others when asked. Denies A/VH. Patient denies any physical complaints when asked. No acute distress noted at present. Routine safety checks conducted according to facility protocol. Encouraged patient to notify staff if thoughts of harm toward self or others arise. Patient verbalize understanding and agreement. Will continue to monitor for safety.

## 2023-06-28 NOTE — ED Provider Notes (Signed)
 Behavioral Health Progress Note  Date and Time: 06/28/2023 12:53 PM Name: Deandra Abegglen. MRN:  [id number]  Subjective:  [name] 33 y.o., male patient presented to [facility] as a voluntary walk in with complaints of self injurious thoughts of banging his head on walls and wanting to get back on the medications that he was previously on at [facility] in 2024. Kannan D Cast Jr., is seen face to face by this provider, consulted with Dr. Docia Freeman; and chart reviewed on 06/28/23.  On evaluation Aza Laue. reports " I am actually feeling a little worse today because I have been having thoughts of wanting to harm myself and bang my head.  I am not sure why.  I did sleep pretty well and I took the medication that we talked about yesterday.  I talked to my dad and I still cannot go to his place because he will get in trouble.  But he did tell me that he talked to my public defender about everything and that they are working on getting me a bed at [facility].  Mother asked that I had an appointment on Monday at a resource center to help get into this program.  He can come and pick me up and take me there. "  On assessment today patient verbalizes having thoughts of wanting to self-harm by banging his head.  He agrees to speak with staff when feeling this way to avoid acting on those thoughts.  We discussed that the plan for him to discharge on Monday with dad so that he can go to the appointment at the [facility] that was set up by his public defender for an opportunity patient to enroll in the [employer] project for temporary housing.  Patient reports sleeping well and having good appetite.  He reports ongoing depressed mood and thoughts of wanting to self-harm however denies suicidal ideations at this time.  Patient does have a recent history of suicide attempt by overdosing on father's TCA medications and ended up in the medical ICU for 4 days  (12/27/2022).   During evaluation [name]. is sitting up in  chair eating, in no acute distress.  He is alert & oriented x 4, calm, cooperative but distracted for this assessment.  His mood is depressed with flat affect.  He has normal speech, and restless behavior.  Objectively there is no evidence of psychosis/mania or delusional thinking. Pt does not appear to be responding to internal or external stimuli.  Patient is able to converse coherently, goal directed thoughts, or pre-occupation.  He currently denies suicidal/ homicidal ideations, psychosis, and paranoia.  Patient does endorse feeling depressed sad and having thoughts of wanting to self-harm.  Patient answered question appropriately.    Diagnosis:  Final diagnoses:  Severe episode of recurrent major depressive disorder, with psychotic features (HCC)    Total Time spent with patient: 20 minutes  Past Psychiatric History: MDD with psychotic features, TBI, acute psychosis, suicide attempt and aggressive behaviors.  Past Medical History: TBI and HTN  Family History: None reported Family Psychiatric  History: Father- depression/anxiety Social History: Pt released from jail 2 days ago. Pt and father were under the impression that he has a bed at [facility] but myself and SW called and he did not have a bed there.  Father reports that his public defender made an appointment with the [facility] at 2:30 on Monday to get set up with an opportunity with the [employer] for temporary housing.  Patient denies substance use.  He is currently unemployed.  Patient is currently on probation seeing his probation officer twice a week.  Probation officer's name is Officer Aisha Ali her phone number is [phone].  I attempted to call Officer for second time, to let them know that patient is here but no answer, HIPAA compliant voicemail was left to return call.  He is unable to stay with father due to seniors only community.  Additional Social History:    Pain Medications: See MAR Prescriptions: See MAR Over the  Counter: See MAR History of alcohol / drug use?: Yes (Pt denies substance use but urine drug screen is positive for cannabis) Longest period of sobriety (when/how long): experimented with various drugs at times but no recent or current use Negative Consequences of Use:  (Unknown) Withdrawal Symptoms: None                    Sleep: Fair  Appetite:  Good  Current Medications:  Current Facility-Administered Medications  Medication Dose Route Frequency Provider Last Rate Last Admin   acetaminophen  (TYLENOL ) tablet 650 mg  650 mg Oral Q6H PRN Nephtali Docken C, NP       alum & mag hydroxide-simeth (MAALOX/MYLANTA) 200-200-20 MG/5ML suspension 30 mL  30 mL Oral Q4H PRN Domingo Fuson C, NP       amLODipine  (NORVASC ) tablet 5 mg  5 mg Oral Daily Taylan Mayhan C, NP   5 mg at 06/28/23 0931   ARIPiprazole  (ABILIFY ) tablet 5 mg  5 mg Oral QHS Debhora Titus C, NP   5 mg at 06/27/23 2121   haloperidol  (HALDOL ) tablet 5 mg  5 mg Oral TID PRN Kimberley Speece C, NP       And   diphenhydrAMINE  (BENADRYL ) capsule 50 mg  50 mg Oral TID PRN Clarabell Matsuoka C, NP       haloperidol  lactate (HALDOL ) injection 5 mg  5 mg Intramuscular TID PRN Neenah Canter C, NP   5 mg at 06/27/23 1859   And   diphenhydrAMINE  (BENADRYL ) injection 50 mg  50 mg Intramuscular TID PRN Davia Erps, NP       And   LORazepam  (ATIVAN ) injection 2 mg  2 mg Intramuscular TID PRN Shereece Wellborn C, NP       haloperidol  lactate (HALDOL ) injection 10 mg  10 mg Intramuscular TID PRN Mary Secord C, NP       And   diphenhydrAMINE  (BENADRYL ) injection 50 mg  50 mg Intramuscular TID PRN Carmon Brigandi C, NP   50 mg at 06/27/23 1859   And   LORazepam  (ATIVAN ) injection 2 mg  2 mg Intramuscular TID PRN Latreshia Beauchaine C, NP   2 mg at 06/27/23 1859   FLUoxetine  (PROZAC ) capsule 20 mg  20 mg Oral Daily Dontrelle Mazon C, NP   20 mg at 06/28/23 0981   hydrOXYzine  (ATARAX ) tablet 25 mg  25 mg Oral TID PRN Eulalia Ellerman C, NP       magnesium   hydroxide (MILK OF MAGNESIA) suspension 30 mL  30 mL Oral Daily PRN Nasira Janusz C, NP       traZODone  (DESYREL ) tablet 50 mg  50 mg Oral QHS PRN Tahjae Durr C, NP   50 mg at 06/27/23 2121   Current Outpatient Medications  Medication Sig Dispense Refill   ARIPiprazole  (ABILIFY ) 5 MG tablet Take 1 tablet (5 mg total) by mouth daily. 30 tablet 0   escitalopram (  LEXAPRO) 10 MG tablet Take 10 mg by mouth daily. Says taking Lexapro in jail- no pharmacy records available unsure of strength     traZODone  (DESYREL ) 50 MG tablet Take 1 tablet (50 mg total) by mouth at bedtime as needed for sleep. 30 tablet 0   amLODipine  (NORVASC ) 5 MG tablet Take 1 tablet (5 mg total) by mouth daily. (Patient not taking: Reported on 06/27/2023) 30 tablet 0   cloNIDine  (CATAPRES ) 0.2 MG tablet Take 1 tablet (0.2 mg total) by mouth 2 (two) times daily. (Patient not taking: Reported on 06/27/2023) 60 tablet 0   FLUoxetine  (PROZAC ) 20 MG capsule Take 1 capsule (20 mg total) by mouth daily. (Patient not taking: Reported on 06/27/2023) 30 capsule 3   hydrochlorothiazide  (HYDRODIURIL ) 12.5 MG tablet Take 1 tablet (12.5 mg total) by mouth daily. (Patient not taking: Reported on 06/27/2023) 30 tablet 0   hydrOXYzine  (ATARAX ) 25 MG tablet Take 1 tablet (25 mg total) by mouth 3 (three) times daily as needed for anxiety. (Patient not taking: Reported on 06/27/2023) 30 tablet 0    Labs  Lab Results:  Admission on 06/27/2023  Component Date Value Ref Range Status   WBC 06/27/2023 7.3  4.0 - 10.5 K/uL Final   RBC 06/27/2023 5.24  4.22 - 5.81 MIL/uL Final   Hemoglobin 06/27/2023 15.5  13.0 - 17.0 g/dL Final   HCT 40/98/1191 45.6  39.0 - 52.0 % Final   MCV 06/27/2023 87.0  80.0 - 100.0 fL Final   MCH 06/27/2023 29.6  26.0 - 34.0 pg Final   MCHC 06/27/2023 34.0  30.0 - 36.0 g/dL Final   RDW 47/82/9562 13.3  11.5 - 15.5 % Final   Platelets 06/27/2023 252  150 - 400 K/uL Final   nRBC 06/27/2023 0.0  0.0 - 0.2 % Final   Neutrophils  Relative % 06/27/2023 50  % Final   Neutro Abs 06/27/2023 3.7  1.7 - 7.7 K/uL Final   Lymphocytes Relative 06/27/2023 34  % Final   Lymphs Abs 06/27/2023 2.5  0.7 - 4.0 K/uL Final   Monocytes Relative 06/27/2023 8  % Final   Monocytes Absolute 06/27/2023 0.6  0.1 - 1.0 K/uL Final   Eosinophils Relative 06/27/2023 7  % Final   Eosinophils Absolute 06/27/2023 0.5  0.0 - 0.5 K/uL Final   Basophils Relative 06/27/2023 1  % Final   Basophils Absolute 06/27/2023 0.1  0.0 - 0.1 K/uL Final   Immature Granulocytes 06/27/2023 0  % Final   Abs Immature Granulocytes 06/27/2023 0.02  0.00 - 0.07 K/uL Final   Performed at [facility], [address] [place], [place] [address]   Sodium 06/27/2023 141  135 - 145 mmol/L Final   Potassium 06/27/2023 3.7  3.5 - 5.1 mmol/L Final   Chloride 06/27/2023 105  98 - 111 mmol/L Final   CO2 06/27/2023 27  22 - 32 mmol/L Final   Glucose, Bld 06/27/2023 98  70 - 99 mg/dL Final   Glucose reference range applies only to samples taken after fasting for at least 8 hours.   BUN 06/27/2023 8  6 - 20 mg/dL Final   Creatinine, Ser 06/27/2023 0.95  0.61 - 1.24 mg/dL Final   Calcium  06/27/2023 10.0  8.9 - 10.3 mg/dL Final   Total Protein 57/84/6962 7.4  6.5 - 8.1 g/dL Final   Albumin 95/28/4132 4.4  3.5 - 5.0 g/dL Final   AST 44/03/270 38  15 - 41 U/L Final   ALT 06/27/2023  42  0 - 44 U/L Final   Alkaline Phosphatase 06/27/2023 45  38 - 126 U/L Final   Total Bilirubin 06/27/2023 0.6  0.0 - 1.2 mg/dL Final   GFR, Estimated 06/27/2023 >60  >60 mL/min Final   Comment: (NOTE) Calculated using the CKD-EPI Creatinine Equation (2021)    Anion gap 06/27/2023 9  5 - 15 Final   Performed at [facility], [address] [place], [place] [address]   Hgb A1c MFr Bld 06/27/2023 4.8  4.8 - 5.6 % Final   Comment: (NOTE) Pre diabetes:          5.7%-6.4%  Diabetes:              >6.4%  Glycemic control for   <7.0% adults with diabetes    Mean Plasma Glucose  06/27/2023 91.06  mg/dL Final   Performed at [facility], [address], [place], [place] [address]   Alcohol, Ethyl (B) 06/27/2023 <15  <15 mg/dL Final   Comment: Please note change in reference range. (NOTE) For medical purposes only. Performed at [facility], [address] [place], [place] [address]    TSH 06/27/2023 0.713  0.350 - 4.500 uIU/mL Final   Comment: Performed by a 3rd Generation assay with a functional sensitivity of <=0.01 uIU/mL. Performed at [facility], [address] [place], [place] [address]    POC Amphetamine UR 06/27/2023 None Detected  NONE DETECTED (Cut Off Level 1000 ng/mL) Final   POC Secobarbital (BAR) 06/27/2023 None Detected  NONE DETECTED (Cut Off Level 300 ng/mL) Final   POC Buprenorphine (BUP) 06/27/2023 None Detected  NONE DETECTED (Cut Off Level 10 ng/mL) Final   POC Oxazepam (BZO) 06/27/2023 None Detected  NONE DETECTED (Cut Off Level 300 ng/mL) Final   POC Cocaine UR 06/27/2023 None Detected  NONE DETECTED (Cut Off Level 300 ng/mL) Final   POC Methamphetamine UR 06/27/2023 None Detected  NONE DETECTED (Cut Off Level 1000 ng/mL) Final   POC Morphine 06/27/2023 None Detected  NONE DETECTED (Cut Off Level 300 ng/mL) Final   POC Methadone UR 06/27/2023 None Detected  NONE DETECTED (Cut Off Level 300 ng/mL) Final   POC Oxycodone UR 06/27/2023 None Detected  NONE DETECTED (Cut Off Level 100 ng/mL) Final   POC Marijuana UR 06/27/2023 None Detected  NONE DETECTED (Cut Off Level 50 ng/mL) Final  Admission on 12/31/2022, Discharged on 01/06/2023  Component Date Value Ref Range Status   Glucose-Capillary 01/01/2023 102 (H)  70 - 99 mg/dL Final   Glucose reference range applies only to samples taken after fasting for at least 8 hours.   Comment 1 01/01/2023 Notify RN   Final   Sodium 01/02/2023 137  135 - 145 mmol/L Final   Potassium 01/02/2023 3.9  3.5 - 5.1 mmol/L Final   Chloride 01/02/2023 101  98 - 111 mmol/L Final   CO2  01/02/2023 27  22 - 32 mmol/L Final   Glucose, Bld 01/02/2023 75  70 - 99 mg/dL Final   Glucose reference range applies only to samples taken after fasting for at least 8 hours.   BUN 01/02/2023 16  6 - 20 mg/dL Final   Creatinine, Ser 01/02/2023 0.81  0.61 - 1.24 mg/dL Final   Calcium  01/02/2023 9.3  8.9 - 10.3 mg/dL Final   GFR, Estimated 01/02/2023 >60  >60 mL/min Final   Comment: (NOTE) Calculated using the CKD-EPI Creatinine Equation (2021)    Anion gap 01/02/2023 9  5 - 15 Final   Performed at [facility], [address] [place], [place] [address]   Hgb A1c MFr Bld 01/02/2023 4.8  4.8 - 5.6 % Final   Comment: (NOTE) Pre diabetes:          5.7%-6.4%  Diabetes:              >6.4%  Glycemic control for   <7.0% adults with diabetes    Mean Plasma Glucose 01/02/2023 91.06  mg/dL Final   Performed at [facility], [address], [place], [place] [address]   TSH 01/02/2023 1.841  0.350 - 4.500 uIU/mL Final   Comment: Performed by a 3rd Generation assay with a functional sensitivity of <=0.01 uIU/mL. Performed at [facility], [address] [place], [place] [address]    Cholesterol 01/02/2023 158  0 - 200 mg/dL Final   Triglycerides 91/47/8295 61  <150 mg/dL Final   HDL 62/13/0865 45  >40 mg/dL Final   Total CHOL/HDL Ratio 01/02/2023 3.5  RATIO Final   VLDL 01/02/2023 12  0 - 40 mg/dL Final   LDL Cholesterol 01/02/2023 101 (H)  0 - 99 mg/dL Final   Comment:        Total Cholesterol/HDL:CHD Risk Coronary Heart Disease Risk Table                     Men   Women  1/2 Average Risk   3.4   3.3  Average Risk       5.0   4.4  2 X Average Risk   9.6   7.1  3 X Average Risk  23.4   11.0        Use the calculated Patient Ratio above and the CHD Risk Table to determine the patient's CHD Risk.        ATP III CLASSIFICATION (LDL):  <100     mg/dL   Optimal  784-696  mg/dL   Near or Above                    Optimal  130-159  mg/dL    Borderline  295-284  mg/dL   High  >132     mg/dL   Very High Performed at [facility], [address], [place], [place] [address]    Vit D, 25-Hydroxy 01/02/2023 40.60  30 - 100 ng/mL Final   Comment: (NOTE) Vitamin D  deficiency has been defined by the Institute of Medicine  and an Endocrine Society practice guideline as a level of serum 25-OH  vitamin D  less than 20 ng/mL (1,2). The Endocrine Society went on to  further define vitamin D  insufficiency as a level between 21 and 29  ng/mL (2).  1. IOM (Institute of Medicine). 2010. Dietary reference intakes for  calcium  and D. [place]: [employer]. 2. Holick MF, Binkley Arthur, Bischoff-Ferrari HA, et al. Evaluation,  treatment, and prevention of vitamin D  deficiency: an Endocrine  Society clinical practice guideline, JCEM. 2011 Jul; 96(7): 1911-30.  Performed at [facility], [address] [place], [place] [address]   No results displayed because visit has over 200 results.      Blood Alcohol level:  Lab Results  Component Value Date   [facility] <15 06/27/2023   ETH <10 12/27/2022    Metabolic Disorder Labs: Lab Results  Component Value Date   HGBA1C 4.8 06/27/2023   MPG 91.06 06/27/2023  MPG 91.06 01/02/2023   No results found for: "PROLACTIN" Lab Results  Component Value Date   CHOL 158 01/02/2023   TRIG 61 01/02/2023   HDL 45 01/02/2023   CHOLHDL 3.5 01/02/2023   VLDL 12 01/02/2023   LDLCALC 101 (H) 01/02/2023   LDLCALC 90 04/21/2021    Therapeutic Lab Levels: Lab Results  Component Value Date   LITHIUM  <0.06 (L) 12/27/2022   No results found for: "VALPROATE" No results found for: "CBMZ"  Physical Findings   AIMS    Flowsheet Row [facility]  AIMS Total Score 0      AUDIT    Flowsheet Row Ad[facility] (Discharged[facility]   Alcohol Use Disorder Identification Test Final Score (AUDIT) 0      Flowsheet Row ED from 06/27/2023 in [facility] Admission (Discharged) [facility] Hosp-Admission (Discharged) from 12/27/2022 in [facility]  C-SSRS RISK CATEGORY Error: Q7 should not be populated when Q6 is No High Risk High Risk        Musculoskeletal  Strength & Muscle Tone: within normal limits Gait & Station: normal Patient leans: N/A  Psychiatric Specialty Exam  Presentation  General Appearance:  Appropriate for Environment  Eye Contact: Good  Speech: Clear and Coherent  Speech Volume: Normal  Handedness: Right   Mood and Affect  Mood: Depressed  Affect: Depressed; Flat   Thought Process  Thought Processes: Coherent; Linear  Descriptions of Associations:Intact  Orientation:Full (Time, Place and Person)  Thought Content:WDL  Diagnosis of Schizophrenia or Schizoaffective disorder in past: No    Hallucinations:Hallucinations: None  Ideas of Reference:Delusions  Suicidal Thoughts:Suicidal Thoughts: No  Homicidal Thoughts:Homicidal Thoughts: No   Sensorium  Memory: Immediate Fair; Recent Fair  Judgment: Fair  Insight: Fair   Art therapist  Concentration: Fair  Attention Span: Fair  Recall: [employer] of Knowledge: Fair  Language: Fair   Psychomotor Activity  Psychomotor Activity: Psychomotor Activity: Restlessness   Assets  Assets: Desire for Improvement; Housing; Physical Health; Social Support; Vocational/Educational   Sleep  Sleep: Sleep: Good Number of Hours of Sleep: 8   Nutritional Assessment (For OBS and FBC admissions only) Has the patient had a weight loss or gain of 10 pounds or more in the last 3 months?: No Has the patient had a decrease in food intake/or appetite?: No Does the patient have dental problems?:  No Does the patient have eating habits or behaviors that may be indicators of an eating disorder including binging or inducing vomiting?: No Has the patient recently lost weight without trying?: 0 Has the patient been eating poorly because of a decreased appetite?: 0 Malnutrition Screening Tool Score: 0    Physical Exam  Physical Exam Vitals and nursing note reviewed.  Constitutional:      Appearance: Normal appearance.  HENT:     Head: Normocephalic.     Nose: Nose normal.  Eyes:     Extraocular Movements: Extraocular movements intact.  Cardiovascular:     Rate and Rhythm: Normal rate.  Pulmonary:     Effort: Pulmonary effort is normal.  Musculoskeletal:        General: Normal range of motion.     Cervical back: Normal range of motion.  Neurological:     General: No focal deficit present.     Mental Status: He is  alert and oriented to person, place, and time.    Review of Systems  Constitutional: Negative.   HENT: Negative.    Eyes: Negative.   Respiratory: Negative.    Cardiovascular: Negative.   Gastrointestinal: Negative.   Genitourinary: Negative.   Musculoskeletal: Negative.   Neurological: Negative.   Endo/Heme/Allergies: Negative.   Psychiatric/Behavioral:  Positive for depression and suicidal ideas.    Blood pressure (!) 138/96, pulse 83, temperature 97.6 F (36.4 C), temperature source Oral, resp. rate 18, SpO2 99%. There is no height or weight on file to calculate BMI.  Treatment Plan Summary: Daily contact with patient to assess and evaluate symptoms and progress in treatment  Patient will remain in continuous assessment unit for observation.  Patient is now verbalizing thoughts of wanting to self-harm and was seen banging his head on the wall yesterday.  Will reevaluate patient for inpatient if head-banging does become active or suicidal ideations become active. Discussed different coping skills to avoid self-harm.  Patient has a history of suicide attempt  in October 2024 after overdosing on TCA medications causing him to be treated in medical ICU for 4 days then sent to [facility] for inpatient psychiatric treatment.  Father reports that patient's public defender/probation officer have set up an appointment for patient to arrive at the [facility] on Monday at 2:30 PM, father is willing to pick patient up and take him there.  Davia Erps, NP 06/28/2023 12:53 PM

## 2023-06-28 NOTE — ED Notes (Signed)
 Patient appears to be sleeping, respirations present, no distress.

## 2023-06-28 NOTE — ED Notes (Signed)
 Provider made aware of flagged BP 142/96. BP rechecked 138/96. Pt denies concerns. Will continue to monitor for safety.

## 2023-06-28 NOTE — ED Notes (Signed)
 Patient is cooperative and compliant with staff directions, denies any homicidal or suicidal ideations as well as any auditory or visual hallucinations. His affect is blunted, but he is social on approach with staff and peers. He stated to nurse that he felt like banging his head, but further stated that he would try not to. Diversional activities provided. Will continue to provide support, redirection, and further interventions as needed.

## 2023-06-29 ENCOUNTER — Other Ambulatory Visit: Payer: Self-pay

## 2023-06-29 NOTE — ED Notes (Signed)
 Patient appears to be seeping, respirations present, no distress.

## 2023-06-29 NOTE — ED Notes (Signed)
 Patient resting quietly in bed with eyes closed. Respirations equal and unlabored, skin warm and dry, NAD. Routine safety checks conducted according to facility protocol. Will continue to monitor for safety.

## 2023-06-29 NOTE — ED Notes (Signed)
 Patient A&Ox4. Denies intent to harm self/others when asked. Denies A/VH. Patient denies any physical complaints when asked. No acute distress noted. Support and encouragement provided. Routine safety checks conducted according to facility protocol. Encouraged patient to notify staff if thoughts of harm toward self or others arise. Patient verbalize understanding and agreement. Will continue to monitor for safety.

## 2023-06-29 NOTE — ED Notes (Signed)
 Dinner given, no s/sx of distress. No further concerns.

## 2023-06-29 NOTE — ED Provider Notes (Signed)
 Behavioral Health Progress Note  Date and Time: 06/29/2023 6:19 PM Name: Patrick Washington. MRN:  [id number]  HPI: Patrick Washington 33 y.o., male patient presented to [facility] as a voluntary walk in on 06/27/2023 with complaints of self injurious thoughts of banging his head on walls and wanting to get back on the medications that he was previously on at [facility] in 2024.   Patrick D Parfait Jr., is seen face to face by this provider, consulted with Dr. Docia Freeman; and chart reviewed on 06/30/23.  Per chart review patient has a past psychiatric history that includes bipolar affective disorder, psychiatric admissions, suicidal ideations, marijuana abuse, and traumatic brain injury.  Currently on assessment patient is observed standing at the nurses station asking for coffee.  He is alert/oriented x 4, cooperative, and fairly attentive.  He has normal speech.  He endorses depression related to his circumstances.  He has a flat affect.  He is unsure where he will live as he is not allowed to live with his father.  He is aware that he has an appointment tomorrow for the door way project.  He continues to state that he has a bed at [facility].  Explained the patient that multiple attempts were made to contact his probation officer and they were unsuccessful.  However that the likelihood of being accepted [facility] is unlikely.  He currently denies any suicidal or self-harm thoughts.  He is able to verbally contract for safety.  He denies access to firearms/weapons.  He denies homicidal ideations.  He is observed taking his fist and slightly pushing his fist  on the counter.  His knuckles have abrasions on them, states he did this in prison as he punched the walls.  States he does this when he feels anxious.  He denies auditory/visual hallucinations.  He does not appear psychotic.  He does not appear to be responding to internal/external stimuli.  Patient has been compliant with  medications Prozac  20 mg daily and Abilify  5 mg nightly.Patrick Washington  He has tolerated them without any adverse reactions.  Discussed different medication treatment options and patient declined.  He does not believe he needs to be on any medication but agrees to stay on the Abilify  and Prozac  at this time.  Call contact patient's father Patrick Washington, Patrick Washington. [phone] reports patient was recently released from jail due to assaulting and police officer.  He was in [place] jail.  He believes that they have been tricked by the police to come and get patient once he was released.  He believes they got tired of housing patient in the facility.  He is adamant that his parole officer told them that patient had been accepted [facility], not the behavioral health hospital but a new facility for those who are less acute.  They were instructed upon patient's release from prison to present to [facility] for mental health evaluation and states they were instructed that patient would remain in this facility until he would be transported to [facility].  Explained to Patrick Washington that is not the process to be accepted to [facility].  He became agitated and states patient is not allowed to live with him as he lives in a elderly community.  He states patient has an appointment tomorrow with [facility] for the [facility] which is housing program.  He wants to pick patient up around 1230 to ensure enough time to present  for appointment.    Diagnosis:  Final diagnoses:  Severe episode of recurrent major depressive disorder, with psychotic features (HCC)    Total Time spent with patient: 20 minutes  Past Psychiatric History: MDD with psychotic features, TBI, acute psychosis, suicide attempt and aggressive behaviors.  Past Medical History: TBI and HTN  Family History: None reported Family Psychiatric  History: Father- depression/anxiety Social History: Pt released from jail 2 days ago. Pt and  father were under the impression that he has a bed at [facility] but myself and SW called and he did not have a bed there.  Father reports that his public defender made an appointment with the [facility] at 2:30 on Monday to get set up with an opportunity with the [facility] [employer] for temporary housing.  Patient denies substance use.  He is currently unemployed.  Patient is currently on probation seeing his probation officer twice a week.  Probation officer's name is Officer Aisha Ali her phone number is [phone].  I attempted to call Officer for second time, to let them know that patient is here but no answer, HIPAA compliant voicemail was left to return call.  He is unable to stay with father due to seniors only community.  Additional Social History:    Pain Medications: See MAR Prescriptions: See MAR Over the Counter: See MAR History of alcohol / drug use?: Yes (Pt denies substance use but urine drug screen is positive for cannabis) Longest period of sobriety (when/how long): experimented with various drugs at times but no recent or current use Negative Consequences of Use:  (Unknown) Withdrawal Symptoms: None                    Sleep: Fair  Appetite:  Good  Current Medications:  Current Facility-Administered Medications  Medication Dose Route Frequency Provider Last Rate Last Admin   acetaminophen  (TYLENOL ) tablet 650 mg  650 mg Oral Q6H PRN Brent, Amanda C, NP       alum & mag hydroxide-simeth (MAALOX/MYLANTA) 200-200-20 MG/5ML suspension 30 mL  30 mL Oral Q4H PRN Brent, Amanda C, NP       amLODipine  (NORVASC ) tablet 5 mg  5 mg Oral Daily Brent, Amanda C, NP   5 mg at 06/29/23 0981   ARIPiprazole  (ABILIFY ) tablet 5 mg  5 mg Oral QHS Brent, Amanda C, NP   5 mg at 06/28/23 2116   haloperidol  (HALDOL ) tablet 5 mg  5 mg Oral TID PRN Brent, Amanda C, NP       And   diphenhydrAMINE  (BENADRYL ) capsule 50 mg  50 mg Oral TID PRN Brent, Amanda C, NP       haloperidol  lactate (HALDOL )  injection 5 mg  5 mg Intramuscular TID PRN Brent, Amanda C, NP   5 mg at 06/27/23 1859   And   diphenhydrAMINE  (BENADRYL ) injection 50 mg  50 mg Intramuscular TID PRN Brent, Amanda C, NP       And   LORazepam  (ATIVAN ) injection 2 mg  2 mg Intramuscular TID PRN Brent, Amanda C, NP       haloperidol  lactate (HALDOL ) injection 10 mg  10 mg Intramuscular TID PRN Davia Erps, NP       And   diphenhydrAMINE  (BENADRYL ) injection 50 mg  50 mg Intramuscular TID PRN Brent, Amanda C, NP   50 mg at 06/27/23 1859   And   LORazepam  (ATIVAN ) injection 2 mg  2 mg Intramuscular TID PRN Brent, Amanda C, NP  2 mg at 06/27/23 1859   FLUoxetine  (PROZAC ) capsule 20 mg  20 mg Oral Daily Brent, Amanda C, NP   20 mg at 06/29/23 0923   hydrOXYzine  (ATARAX ) tablet 25 mg  25 mg Oral TID PRN Brent, Amanda C, NP       magnesium  hydroxide (MILK OF MAGNESIA) suspension 30 mL  30 mL Oral Daily PRN Brent, Amanda C, NP       traZODone  (DESYREL ) tablet 50 mg  50 mg Oral QHS PRN Brent, Amanda C, NP   50 mg at 06/28/23 2116   Current Outpatient Medications  Medication Sig Dispense Refill   ARIPiprazole  (ABILIFY ) 5 MG tablet Take 1 tablet (5 mg total) by mouth daily. 30 tablet 0   escitalopram (LEXAPRO) 10 MG tablet Take 10 mg by mouth daily. Says taking Lexapro in jail- no pharmacy records available unsure of strength     traZODone  (DESYREL ) 50 MG tablet Take 1 tablet (50 mg total) by mouth at bedtime as needed for sleep. 30 tablet 0   amLODipine  (NORVASC ) 5 MG tablet Take 1 tablet (5 mg total) by mouth daily. (Patient not taking: Reported on 06/27/2023) 30 tablet 0   cloNIDine  (CATAPRES ) 0.2 MG tablet Take 1 tablet (0.2 mg total) by mouth 2 (two) times daily. (Patient not taking: Reported on 06/27/2023) 60 tablet 0   FLUoxetine  (PROZAC ) 20 MG capsule Take 1 capsule (20 mg total) by mouth daily. (Patient not taking: Reported on 06/27/2023) 30 capsule 3   hydrochlorothiazide  (HYDRODIURIL ) 12.5 MG tablet Take 1 tablet (12.5 mg  total) by mouth daily. (Patient not taking: Reported on 06/27/2023) 30 tablet 0   hydrOXYzine  (ATARAX ) 25 MG tablet Take 1 tablet (25 mg total) by mouth 3 (three) times daily as needed for anxiety. (Patient not taking: Reported on 06/27/2023) 30 tablet 0    Labs  Lab Results:  Admission on 06/27/2023  Component Date Value Ref Range Status   WBC 06/27/2023 7.3  4.0 - 10.5 K/uL Final   RBC 06/27/2023 5.24  4.22 - 5.81 MIL/uL Final   Hemoglobin 06/27/2023 15.5  13.0 - 17.0 g/dL Final   HCT 16/12/9602 45.6  39.0 - 52.0 % Final   MCV 06/27/2023 87.0  80.0 - 100.0 fL Final   MCH 06/27/2023 29.6  26.0 - 34.0 pg Final   MCHC 06/27/2023 34.0  30.0 - 36.0 g/dL Final   RDW 54/11/8117 13.3  11.5 - 15.5 % Final   Platelets 06/27/2023 252  150 - 400 K/uL Final   nRBC 06/27/2023 0.0  0.0 - 0.2 % Final   Neutrophils Relative % 06/27/2023 50  % Final   Neutro Abs 06/27/2023 3.7  1.7 - 7.7 K/uL Final   Lymphocytes Relative 06/27/2023 34  % Final   Lymphs Abs 06/27/2023 2.5  0.7 - 4.0 K/uL Final   Monocytes Relative 06/27/2023 8  % Final   Monocytes Absolute 06/27/2023 0.6  0.1 - 1.0 K/uL Final   Eosinophils Relative 06/27/2023 7  % Final   Eosinophils Absolute 06/27/2023 0.5  0.0 - 0.5 K/uL Final   Basophils Relative 06/27/2023 1  % Final   Basophils Absolute 06/27/2023 0.1  0.0 - 0.1 K/uL Final   Immature Granulocytes 06/27/2023 0  % Final   Abs Immature Granulocytes 06/27/2023 0.02  0.00 - 0.07 K/uL Final   Performed at [facility], [address], [place], [place] [address]   Sodium 06/27/2023 141  135 - 145 mmol/L Final   Potassium 06/27/2023 3.7  3.5 - 5.1 mmol/L Final   Chloride 06/27/2023 105  98 - 111 mmol/L Final   CO2 06/27/2023 27  22 - 32 mmol/L Final   Glucose, Bld 06/27/2023 98  70 - 99 mg/dL Final   Glucose reference range applies only to samples taken after fasting for at least 8 hours.   BUN 06/27/2023 8  6 - 20 mg/dL Final   Creatinine, Ser 06/27/2023 0.95  0.61 - 1.24  mg/dL Final   Calcium  06/27/2023 10.0  8.9 - 10.3 mg/dL Final   Total Protein 40/98/1191 7.4  6.5 - 8.1 g/dL Final   Albumin 47/82/9562 4.4  3.5 - 5.0 g/dL Final   AST 13/10/6576 38  15 - 41 U/L Final   ALT 06/27/2023 42  0 - 44 U/L Final   Alkaline Phosphatase 06/27/2023 45  38 - 126 U/L Final   Total Bilirubin 06/27/2023 0.6  0.0 - 1.2 mg/dL Final   GFR, Estimated 06/27/2023 >60  >60 mL/min Final   Comment: (NOTE) Calculated using the CKD-EPI Creatinine Equation (2021)    Anion gap 06/27/2023 9  5 - 15 Final   Performed at [facility], [address], [place], [place] [address]   Hgb A1c MFr Bld 06/27/2023 4.8  4.8 - 5.6 % Final   Comment: (NOTE) Pre diabetes:          5.7%-6.4%  Diabetes:              >6.4%  Glycemic control for   <7.0% adults with diabetes    Mean Plasma Glucose 06/27/2023 91.06  mg/dL Final   Performed at [facility], [address], [place], [place] [address]   Alcohol, Ethyl (B) 06/27/2023 <15  <15 mg/dL Final   Comment: Please note change in reference range. (NOTE) For medical purposes only. Performed at [facility], [address], [place], [place] [address]    TSH 06/27/2023 0.713  0.350 - 4.500 uIU/mL Final   Comment: Performed by a 3rd Generation assay with a functional sensitivity of <=0.01 uIU/mL. Performed at [facility], [address], [place], [place] [address]    POC Amphetamine UR 06/27/2023 None Detected  NONE DETECTED (Cut Off Level 1000 ng/mL) Final   POC Secobarbital (BAR) 06/27/2023 None Detected  NONE DETECTED (Cut Off Level 300 ng/mL) Final   POC Buprenorphine (BUP) 06/27/2023 None Detected  NONE DETECTED (Cut Off Level 10 ng/mL) Final   POC Oxazepam (BZO) 06/27/2023 None Detected  NONE DETECTED (Cut Off Level 300 ng/mL) Final   POC Cocaine UR 06/27/2023 None Detected  NONE DETECTED (Cut Off Level 300 ng/mL) Final   POC Methamphetamine UR 06/27/2023 None Detected  NONE DETECTED (Cut Off Level 1000 ng/mL)  Final   POC Morphine 06/27/2023 None Detected  NONE DETECTED (Cut Off Level 300 ng/mL) Final   POC Methadone UR 06/27/2023 None Detected  NONE DETECTED (Cut Off Level 300 ng/mL) Final   POC Oxycodone UR 06/27/2023 None Detected  NONE DETECTED (Cut Off Level 100 ng/mL) Final   POC Marijuana UR 06/27/2023 None Detected  NONE DETECTED (Cut Off Level 50 ng/mL) Final  Admission on 12/31/2022, Discharged on 01/06/2023  Component Date Value Ref Range Status   Glucose-Capillary 01/01/2023 102 (H)  70 - 99 mg/dL Final   Glucose reference range applies only to samples taken after fasting for at least 8 hours.   Comment 1 01/01/2023 Notify RN   Final   Sodium 01/02/2023 137  135 -  145 mmol/L Final   Potassium 01/02/2023 3.9  3.5 - 5.1 mmol/L Final   Chloride 01/02/2023 101  98 - 111 mmol/L Final   CO2 01/02/2023 27  22 - 32 mmol/L Final   Glucose, Bld 01/02/2023 75  70 - 99 mg/dL Final   Glucose reference range applies only to samples taken after fasting for at least 8 hours.   BUN 01/02/2023 16  6 - 20 mg/dL Final   Creatinine, Ser 01/02/2023 0.81  0.61 - 1.24 mg/dL Final   Calcium  01/02/2023 9.3  8.9 - 10.3 mg/dL Final   GFR, Estimated 01/02/2023 >60  >60 mL/min Final   Comment: (NOTE) Calculated using the CKD-EPI Creatinine Equation (2021)    Anion gap 01/02/2023 9  5 - 15 Final   Performed at [facility], [address] [place], [place] [address]   Hgb A1c MFr Bld 01/02/2023 4.8  4.8 - 5.6 % Final   Comment: (NOTE) Pre diabetes:          5.7%-6.4%  Diabetes:              >6.4%  Glycemic control for   <7.0% adults with diabetes    Mean Plasma Glucose 01/02/2023 91.06  mg/dL Final   Performed at [facility], [address], [place], [place] [address]   TSH 01/02/2023 1.841  0.350 - 4.500 uIU/mL Final   Comment: Performed by a 3rd Generation assay with a functional sensitivity of <=0.01 uIU/mL. Performed at [facility], [address]  [place], [place] [address]    Cholesterol 01/02/2023 158  0 - 200 mg/dL Final   Triglycerides 91/47/8295 61  <150 mg/dL Final   HDL 62/13/0865 45  >40 mg/dL Final   Total CHOL/HDL Ratio 01/02/2023 3.5  RATIO Final   VLDL 01/02/2023 12  0 - 40 mg/dL Final   LDL Cholesterol 01/02/2023 101 (H)  0 - 99 mg/dL Final   Comment:        Total Cholesterol/HDL:CHD Risk Coronary Heart Disease Risk Table                     Men   Women  1/2 Average Risk   3.4   3.3  Average Risk       5.0   4.4  2 X Average Risk   9.6   7.1  3 X Average Risk  23.4   11.0        Use the calculated Patient Ratio above and the CHD Risk Table to determine the patient's CHD Risk.        ATP III CLASSIFICATION (LDL):  <100     mg/dL   Optimal  784-696  mg/dL   Near or Above                    Optimal  130-159  mg/dL   Borderline  295-284  mg/dL   High  >132     mg/dL   Very High Performed at [facility], [address], [place], [place] [address]    Vit D, 25-Hydroxy 01/02/2023 40.60  30 - 100 ng/mL Final   Comment: (NOTE) Vitamin D  deficiency has been defined by the Institute of Medicine  and an Endocrine Society practice guideline as a level of serum 25-OH  vitamin D  less than 20 ng/mL (1,2). The Endocrine Society went on to  further define vitamin D  insufficiency as a level between 21 and  29  ng/mL (2).  1. IOM (Institute of Medicine). 2010. Dietary reference intakes for  calcium  and D. [place]: [employer]. 2. Holick MF, Binkley , Bischoff-Ferrari HA, et al. Evaluation,  treatment, and prevention of vitamin D  deficiency: an Endocrine  Society clinical practice guideline, JCEM. 2011 Jul; 96(7): 1911-30.  Performed at [facility], [address] [place], [place] [address]   No results displayed because visit has over 200 results.      Blood Alcohol level:  Lab Results  Component Value Date   [facility] <15 06/27/2023   ETH <10 12/27/2022    Metabolic  Disorder Labs: Lab Results  Component Value Date   HGBA1C 4.8 06/27/2023   MPG 91.06 06/27/2023   MPG 91.06 01/02/2023   No results found for: "PROLACTIN" Lab Results  Component Value Date   CHOL 158 01/02/2023   TRIG 61 01/02/2023   HDL 45 01/02/2023   CHOLHDL 3.5 01/02/2023   VLDL 12 01/02/2023   LDLCALC 101 (H) 01/02/2023   LDLCALC 90 04/21/2021    Therapeutic Lab Levels: Lab Results  Component Value Date   LITHIUM  <0.06 (L) 12/27/2022   No results found for: "VALPROATE" No results found for: "CBMZ"  Physical Findings   AIMS    Flowsheet Row [facility]  AIMS Total Score 0      AUDIT    Flowsheet Row [facility]  Alcohol Use Disorder Identification Test Final Score (AUDIT) 0      Flowsheet Row ED from 06/27/2023 in [facility] Admission (Discharged) [facility] Hosp-Admission (Discharged) from 12/27/2022 in [place] [facility]  C-SSRS RISK CATEGORY Error: Q7 should not be populated when Q6 is No High Risk High Risk        Musculoskeletal  Strength & Muscle Tone: within normal limits Gait & Station: normal Patient leans: N/A  Psychiatric Specialty Exam  Presentation  General Appearance:  Casual  Eye Contact: Fair  Speech: Clear and Coherent  Speech Volume: Normal  Handedness: Right   Mood and Affect  Mood: Anxious  and depressed   Affect: Congruent; Depressed; Flat   Thought Process  Thought Processes: Coherent  Descriptions of Associations:Intact  Orientation:Full (Time, Place and Person)  Thought Content:WDL  Diagnosis of Schizophrenia or Schizoaffective disorder in past: No    Hallucinations:Hallucinations: None  Ideas of Reference:None  Suicidal Thoughts: denies SI and  HI, no plan ,no intent no access to means.  Homicidal Thoughts:Homicidal Thoughts: No   Sensorium  Memory: Recent Fair; Immediate Good  Judgment: Fair  Insight: Fair   Chartered certified accountant: Fair  Attention Span: Fair  Recall: [employer] of Knowledge: Fair  Language: Fair   Psychomotor Activity  Psychomotor Activity: Psychomotor Activity: Normal   Assets  Assets: Manufacturing systems engineer; Desire for Improvement; Financial Resources/Insurance; Physical Health; Resilience; Social Support   Sleep  Sleep: Sleep: Fair Number of Hours of Sleep: 6   No data recorded   Physical Exam  Physical Exam Vitals and nursing note reviewed.  Constitutional:      Appearance: Normal appearance.  HENT:     Head: Normocephalic.     Nose: Nose normal.  Eyes:     Extraocular Movements: Extraocular movements intact.  Cardiovascular:     Rate and Rhythm: Normal rate.  Pulmonary:  Effort: Pulmonary effort is normal.  Musculoskeletal:        General: Normal range of motion.     Cervical back: Normal range of motion.  Neurological:     General: No focal deficit present.     Mental Status: He is alert and oriented to person, place, and time.    Review of Systems  Constitutional: Negative.   HENT: Negative.    Eyes: Negative.   Respiratory: Negative.    Cardiovascular: Negative.   Gastrointestinal: Negative.   Genitourinary: Negative.   Musculoskeletal: Negative.   Neurological: Negative.   Endo/Heme/Allergies: Negative.   Psychiatric/Behavioral:  Positive for depression. The patient is nervous/anxious.    Blood pressure 128/86, pulse 72, temperature 97.8 F (36.6 C), temperature source Oral, resp. rate 18, SpO2 98%. There is no height or weight on file to calculate BMI.  Treatment Plan Summary:  Patient will remain in continuous assessment unit for observation at this time. Plan to d/c on 06/30/2023  Father reports that patient's public  defender/probation officer have set up an appointment for patient to arrive at the [facility] on Monday at 2:30 PM, father is willing to pick patient up and take him there.  This is an intake appointment for the [facility] project which is for housing.   Costella Dirks, NP 06/29/2023 6:19 PM

## 2023-06-29 NOTE — ED Notes (Signed)
 Salad given d/t dinner being late. RN has called dietary twice to ask about dinner, states it will be here by 1900.

## 2023-06-29 NOTE — ED Notes (Signed)
 Pt is currently asleep, resp are even and unlabored. There are no apparent s/sx of distress. No further concerns at this time.

## 2023-06-30 MED ORDER — FLUOXETINE HCL 20 MG PO CAPS: 20.0000 mg | ORAL_CAPSULE | Freq: Every day | ORAL | 0 refills | Status: AC

## 2023-06-30 MED ORDER — TRAZODONE HCL 50 MG PO TABS: 50.0000 mg | ORAL_TABLET | Freq: Every day | ORAL | Status: AC

## 2023-06-30 MED ORDER — AMLODIPINE BESYLATE 5 MG PO TABS: 5.0000 mg | ORAL_TABLET | Freq: Every day | ORAL | Status: AC

## 2023-06-30 MED ORDER — ARIPIPRAZOLE 5 MG PO TABS: 5.0000 mg | ORAL_TABLET | Freq: Every day | ORAL | Status: AC

## 2023-06-30 MED ORDER — FLUOXETINE HCL 20 MG PO CAPS: 20.0000 mg | ORAL_CAPSULE | Freq: Every day | ORAL | Status: AC

## 2023-06-30 MED ORDER — AMLODIPINE BESYLATE 5 MG PO TABS: 5.0000 mg | ORAL_TABLET | Freq: Every day | ORAL | 0 refills | Status: DC

## 2023-06-30 MED ORDER — HYDROXYZINE HCL 25 MG PO TABS: 25.0000 mg | ORAL_TABLET | Freq: Three times a day (TID) | ORAL | Status: DC | PRN

## 2023-06-30 MED ORDER — ARIPIPRAZOLE 5 MG PO TABS
5.0000 mg | ORAL_TABLET | Freq: Every day | ORAL | 0 refills | Status: AC
Start: 2023-06-30 — End: ?

## 2023-06-30 MED ORDER — HYDROXYZINE HCL 25 MG PO TABS: 25.0000 mg | ORAL_TABLET | Freq: Three times a day (TID) | ORAL | 0 refills | Status: AC | PRN

## 2023-06-30 MED ORDER — TRAZODONE HCL 50 MG PO TABS
50.0000 mg | ORAL_TABLET | Freq: Every evening | ORAL | 0 refills | Status: AC | PRN
Start: 2023-06-30 — End: ?

## 2023-06-30 NOTE — Discharge Instructions (Addendum)
 You have an appointment at 8am on 08/01/2023 with Dr. Lorna Rose for medication management.

## 2023-06-30 NOTE — ED Notes (Signed)
 Patient wanted to look at television, he wanted to see the program from last night which was a Anime with sex and violence in it. I told him he could not watch that, he could only see PG 13 rated programs. The patient got upset and started using profanity, he called the [employer] tech names, using profanity, he was very aggressive in his language and mood. He said shut the blank TV off, I informed him he is not the only one he watches tv, and put on a appropriate program for another patient to watch. I informed the nurse of his behavior.

## 2023-06-30 NOTE — ED Notes (Addendum)
 Pt leaving now. Belongings returned. AVS given, rx meds discussed. Sample meds given. Discussed upcoming appointment as well. No s/sx of distress, no further concerns.

## 2023-06-30 NOTE — ED Notes (Signed)
 Pt is currently asleep, resp are even and unlabored. There are no apparent s/sx of distress. No further concerns at this time.

## 2023-06-30 NOTE — ED Notes (Signed)
 Pt has a meeting with NP & PO at 1030

## 2023-06-30 NOTE — ED Provider Notes (Signed)
 FBC/OBS ASAP Discharge Summary  Date and Time: 06/30/2023 12:55 PM  Name: Patrick Washington.  MRN:  [id number]   Discharge Diagnoses:  Final diagnoses:  Severe episode of recurrent major depressive disorder, with psychotic features (HCC)   HPI: Patrick Washington 33 y.o., male patient presented to [facility] as a voluntary walk in on 06/27/2023 with complaints of self injurious thoughts of banging his head on walls and wanting to get back on the medications that he was previously on at [facility] in 2024.    Tou D Corum Jr., is seen face to face by this provider, consulted with Dr. Genita Keys; and chart reviewed on 06/30/23.  Per chart review patient has a past psychiatric history that includes bipolar affective disorder, psychiatric admissions, suicidal ideations, marijuana abuse, and traumatic brain injury.  Subjective:   Currently on assessment patient is observed sitting in his bed eating.  He immediately asks when he will be able to be discharged.  He currently denies any depression and states he is looking forward to getting out of the facility and hopefully being accepted into the doorway project.  He appears anxious.  He is currently denying any suicidal or homicidal ideations.He continues to have a flat affect.  He denies any access to firearms/weapons.  He has been compliant with medications while on the unit.  He has required no as needed medications for agitation.  He denies any auditory/visual hallucinations.  He does not appear manic, psychotic, delusional, or paranoid.  He does not appear to be responding to internal/external stimuli.  Patient's parole officer Ms. Aisha Ali returned a call that was placed to her when patient originally presented for admission.  She knew very little about patient as she just picked him up this past Friday and provided the number for mental health court Doloris Freund  [phone].   With patient's permission called Ms. Irving Mantle she would not speak without patient present unless  there was an ROI in place.  Placed patient in assessment room and called Ms. Irving Mantle again.  Ms. Irving Mantle states that patient was instructed to follow-up with [facility] outpatient services.  States he was never instructed to present for admission.  Also states that patient does have a meeting today with the doorway project for housing with the [facility] at [address] and he does not need to miss this appointment.  Once patient is completed with appointment he may present to mental health court on the second floor of the court house in [place]. [address].  Here he will sign additional documents that will be needed for him to be referred to a project with [facility] for [facility].  Of note: This is not part of [facility] where acute patients are held this is a new facility for housing.  Contacted patient's father [name], Sr.  He will pick patient up at 1230 and take him to his [facility] appointment and to mental health court.  Stay Summary:   [name]. was admitted to the observation unit.  He was treated with Prozac  20 mg daily, Abilify  5 mg nightly, amlodipine  5 mg daily, hydroxyzine  25 mg 3 times daily as needed, and trazodone  50 mg nightly.  He tolerated medications without any adverse reactions.   [name] was discharged with current medication and was instructed on how to take medications as prescribed; 7-day sample was given for all medications.  He was also provided with a 45-day refill for medications to bridge him  until his medication management appointment with [facility] on the second floor Dr. Lorna Rose on 08/01/2023 at 8 AM  Upon completion of this admission the Patrick Washington. was both mentally and medically stable for discharge denying suicidal/homicidal ideation, auditory/visual/tactile hallucinations, delusional thoughts and paranoia.      Total Time spent with patient: 30 minutes  Past Psychiatric History: see H&P Past Medical History: see H&P Family History: see H&P Family  Psychiatric History: see H&P Social History: see H&P Tobacco Cessation:  N/A, patient does not currently use tobacco products  Current Medications:  Current Facility-Administered Medications  Medication Dose Route Frequency Provider Last Rate Last Admin   acetaminophen  (TYLENOL ) tablet 650 mg  650 mg Oral Q6H PRN Brent, Amanda C, NP       alum & mag hydroxide-simeth (MAALOX/MYLANTA) 200-200-20 MG/5ML suspension 30 mL  30 mL Oral Q4H PRN Brent, Amanda C, NP       amLODipine  (NORVASC ) tablet 5 mg  5 mg Oral Daily Brent, Amanda C, NP   5 mg at 06/30/23 2536   ARIPiprazole  (ABILIFY ) tablet 5 mg  5 mg Oral QHS Brent, Amanda C, NP   5 mg at 06/29/23 2115   haloperidol  (HALDOL ) tablet 5 mg  5 mg Oral TID PRN Brent, Amanda C, NP       And   diphenhydrAMINE  (BENADRYL ) capsule 50 mg  50 mg Oral TID PRN Brent, Amanda C, NP       haloperidol  lactate (HALDOL ) injection 5 mg  5 mg Intramuscular TID PRN Brent, Amanda C, NP   5 mg at 06/27/23 1859   And   diphenhydrAMINE  (BENADRYL ) injection 50 mg  50 mg Intramuscular TID PRN Brent, Amanda C, NP       And   LORazepam  (ATIVAN ) injection 2 mg  2 mg Intramuscular TID PRN Brent, Amanda C, NP       haloperidol  lactate (HALDOL ) injection 10 mg  10 mg Intramuscular TID PRN Davia Erps, NP       And   diphenhydrAMINE  (BENADRYL ) injection 50 mg  50 mg Intramuscular TID PRN Brent, Amanda C, NP   50 mg at 06/27/23 1859   And   LORazepam  (ATIVAN ) injection 2 mg  2 mg Intramuscular TID PRN Brent, Amanda C, NP   2 mg at 06/27/23 1859   FLUoxetine  (PROZAC ) capsule 20 mg  20 mg Oral Daily Brent, Amanda C, NP   20 mg at 06/30/23 6440   hydrOXYzine  (ATARAX ) tablet 25 mg  25 mg Oral TID PRN Brent, Amanda C, NP   25 mg at 06/29/23 2115   magnesium  hydroxide (MILK OF MAGNESIA) suspension 30 mL  30 mL Oral Daily PRN Brent, Amanda C, NP       traZODone  (DESYREL ) tablet 50 mg  50 mg Oral QHS PRN Brent, Amanda C, NP   50 mg at 06/29/23 2115   Current Outpatient Medications   Medication Sig Dispense Refill   amLODipine  (NORVASC ) 5 MG tablet Take 1 tablet (5 mg total) by mouth daily.     ARIPiprazole  (ABILIFY ) 5 MG tablet Take 1 tablet (5 mg total) by mouth daily.     FLUoxetine  (PROZAC ) 20 MG capsule Take 1 capsule (20 mg total) by mouth daily.     hydrOXYzine  (ATARAX ) 25 MG tablet Take 1 tablet (25 mg total) by mouth 3 (three) times daily as needed.     traZODone  (DESYREL ) 50 MG tablet Take 1 tablet (50 mg total) by mouth at bedtime.  amLODipine  (NORVASC ) 5 MG tablet Take 1 tablet (5 mg total) by mouth daily. 45 tablet 0   ARIPiprazole  (ABILIFY ) 5 MG tablet Take 1 tablet (5 mg total) by mouth at bedtime. 45 tablet 0   FLUoxetine  (PROZAC ) 20 MG capsule Take 1 capsule (20 mg total) by mouth daily. 45 capsule 0   hydrOXYzine  (ATARAX ) 25 MG tablet Take 1 tablet (25 mg total) by mouth 3 (three) times daily as needed for anxiety. 60 tablet 0   traZODone  (DESYREL ) 50 MG tablet Take 1 tablet (50 mg total) by mouth at bedtime as needed for sleep. 45 tablet 0    PTA Medications:  PTA Medications  Medication Sig   ARIPiprazole  (ABILIFY ) 5 MG tablet Take 1 tablet (5 mg total) by mouth daily.   amLODipine  (NORVASC ) 5 MG tablet Take 1 tablet (5 mg total) by mouth daily.   FLUoxetine  (PROZAC ) 20 MG capsule Take 1 capsule (20 mg total) by mouth daily.   traZODone  (DESYREL ) 50 MG tablet Take 1 tablet (50 mg total) by mouth at bedtime.   hydrOXYzine  (ATARAX ) 25 MG tablet Take 1 tablet (25 mg total) by mouth 3 (three) times daily as needed.   ARIPiprazole  (ABILIFY ) 5 MG tablet Take 1 tablet (5 mg total) by mouth at bedtime.   FLUoxetine  (PROZAC ) 20 MG capsule Take 1 capsule (20 mg total) by mouth daily.   hydrOXYzine  (ATARAX ) 25 MG tablet Take 1 tablet (25 mg total) by mouth 3 (three) times daily as needed for anxiety.   traZODone  (DESYREL ) 50 MG tablet Take 1 tablet (50 mg total) by mouth at bedtime as needed for sleep.   amLODipine  (NORVASC ) 5 MG tablet Take 1 tablet (5 mg  total) by mouth daily.   Facility Ordered Medications  Medication   acetaminophen  (TYLENOL ) tablet 650 mg   alum & mag hydroxide-simeth (MAALOX/MYLANTA) 200-200-20 MG/5ML suspension 30 mL   magnesium  hydroxide (MILK OF MAGNESIA) suspension 30 mL   haloperidol  (HALDOL ) tablet 5 mg   And   diphenhydrAMINE  (BENADRYL ) capsule 50 mg   haloperidol  lactate (HALDOL ) injection 5 mg   And   diphenhydrAMINE  (BENADRYL ) injection 50 mg   And   LORazepam  (ATIVAN ) injection 2 mg   haloperidol  lactate (HALDOL ) injection 10 mg   And   diphenhydrAMINE  (BENADRYL ) injection 50 mg   And   LORazepam  (ATIVAN ) injection 2 mg   hydrOXYzine  (ATARAX ) tablet 25 mg   traZODone  (DESYREL ) tablet 50 mg   FLUoxetine  (PROZAC ) capsule 20 mg   amLODipine  (NORVASC ) tablet 5 mg   ARIPiprazole  (ABILIFY ) tablet 5 mg        No data to display          Flowsheet Row ED from 06/27/2023 in [facility]Discharged) [facility] (Discharged) from 12/27/2022 in [place] [facility]  C-SSRS RISK CATEGORY Error: Q7 should not be populated when Q6 is No High Risk High Risk       Musculoskeletal  Strength & Muscle Tone: within normal limits Gait & Station: normal Patient leans: N/A  Psychiatric Specialty Exam  Presentation  General Appearance:  Casual  Eye Contact: Good  Speech: Clear and Coherent; Normal Rate  Speech Volume: Normal  Handedness: Right   Mood and Affect  Mood: Anxious  Affect: Congruent   Thought Process  Thought Processes: Coherent  Descriptions of Associations:Intact  Orientation:Full (Time, Place and Person)  Thought Content:Logical  Diagnosis of  Schizophrenia or Schizoaffective disorder in past: No    Hallucinations:Hallucinations: None  Ideas of Reference:None  Suicidal Thoughts:Suicidal Thoughts: No  Homicidal Thoughts:Homicidal  Thoughts: No   Sensorium  Memory: Immediate Good; Recent Good; Remote Good  Judgment: Fair  Insight: Fair   Art therapist  Concentration: Good  Attention Span: Good  Recall: Good  Fund of Knowledge: Good  Language: Good   Psychomotor Activity  Psychomotor Activity:Psychomotor Activity: Normal   Assets  Assets: Physical Health; Resilience; Social Support; Manufacturing systems engineer; Desire for Improvement   Sleep  Sleep:Sleep: Good   No data recorded  Physical Exam  Physical Exam Constitutional:      Appearance: Normal appearance.  Eyes:     General:        Right eye: No discharge.        Left eye: No discharge.  Cardiovascular:     Rate and Rhythm: Normal rate.  Pulmonary:     Effort: Pulmonary effort is normal. No respiratory distress.  Musculoskeletal:     Cervical back: Normal range of motion.  Neurological:     Mental Status: He is alert and oriented to person, place, and time.  Psychiatric:        Attention and Perception: Attention and perception normal.        Mood and Affect: Mood is anxious. Mood is not depressed.        Speech: Speech normal.        Behavior: Behavior normal. Behavior is cooperative.        Thought Content: Thought content normal.        Cognition and Memory: Cognition normal.        Judgment: Judgment is impulsive.    Review of Systems  Constitutional:  Negative for chills and fever.  HENT:  Negative for hearing loss.   Respiratory:  Negative for cough and shortness of breath.   Cardiovascular:  Negative for chest pain.  Musculoskeletal: Negative.   Neurological:  Negative for tremors.  Psychiatric/Behavioral:  The patient is nervous/anxious.    Blood pressure 120/74, pulse 75, temperature 98.5 F (36.9 C), temperature source Oral, resp. rate 18, SpO2 100%. There is no height or weight on file to calculate BMI.  Demographic Factors:  Male, Caucasian, Low socioeconomic status, and Unemployed  Loss  Factors: Legal issues  Historical Factors: Impulsivity  Risk Reduction Factors:   Sense of responsibility to family, Positive social support, Positive therapeutic relationship, and Positive coping skills or problem solving skills  Continued Clinical Symptoms:  Severe Anxiety and/or Agitation Depression:   Impulsivity More than one psychiatric diagnosis Previous Psychiatric Diagnoses and Treatments  Cognitive Features That Contribute To Risk:  None    Suicide Risk:  Minimal: No identifiable suicidal ideation.  Patients presenting with no risk factors but with morbid ruminations; may be classified as minimal risk based on the severity of the depressive symptoms  Plan Of Care/Follow-up recommendations:  Activity:  as tolerated  Diet:  regular   Disposition:   Discharge patient  Patient provided sample medications for amlodipine  5 mg daily, hydroxyzine  25 mg 3 times daily as needed, trazodone  50 mg nightly as needed, Abilify  5 mg nightly, Prozac  20 mg daily-in addition a 45-day supply was sent to patient's pharmacy to bridge him to his first medication management appointment with [facility] Dr. Lorna Rose on 08/01/2023,  Patient has an appointment today with [facility] for the doorway project for housing at 2:30 PM-patient's father will pick him up from the facility and take him  to the appointment.   After [facility] appointment father agrees to take patient to [facility] mental health court to fill out paperwork for a different type of housing that is with [facility] (this is not be acute [facility] hospital this is a living facility).  Costella Dirks, NP 06/30/2023, 12:55 PM

## 2023-08-01 ENCOUNTER — Ambulatory Visit (HOSPITAL_COMMUNITY): Payer: MEDICAID | Admitting: Student

## 2024-04-01 ENCOUNTER — Other Ambulatory Visit: Payer: Self-pay

## 2024-04-05 ENCOUNTER — Ambulatory Visit (INDEPENDENT_AMBULATORY_CARE_PROVIDER_SITE_OTHER): Payer: MEDICAID

## 2024-04-05 DIAGNOSIS — F411 Generalized anxiety disorder: Secondary | ICD-10-CM | POA: Diagnosis not present
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
## 2024-04-06 ENCOUNTER — Encounter (HOSPITAL_COMMUNITY): Payer: Self-pay | Admitting: Physician Assistant

## 2024-04-06 ENCOUNTER — Ambulatory Visit (HOSPITAL_COMMUNITY): Payer: MEDICAID | Admitting: Physician Assistant

## 2024-04-06 DIAGNOSIS — F411 Generalized anxiety disorder: Secondary | ICD-10-CM

## 2024-04-06 DIAGNOSIS — F250 Schizoaffective disorder, bipolar type: Secondary | ICD-10-CM

## 2024-04-06 DIAGNOSIS — Z8679 Personal history of other diseases of the circulatory system: Secondary | ICD-10-CM

## 2024-04-06 MED ORDER — AMLODIPINE BESYLATE 5 MG PO TABS: 5.0000 mg | ORAL_TABLET | Freq: Every day | ORAL | 0 refills | Status: AC

## 2024-04-06 MED ORDER — HYDROXYZINE HCL 25 MG PO TABS: 25.0000 mg | ORAL_TABLET | Freq: Three times a day (TID) | ORAL | 0 refills | Status: AC | PRN

## 2024-04-06 MED ORDER — DIVALPROEX SODIUM 500 MG PO DR TAB: DELAYED_RELEASE_TABLET | ORAL | 0 refills | Status: AC

## 2024-04-06 MED ORDER — DIVALPROEX SODIUM 500 MG PO DR TAB: 500.0000 mg | DELAYED_RELEASE_TABLET | Freq: Two times a day (BID) | ORAL | 0 refills | Status: AC
